# Patient Record
Sex: Female | Born: 1940 | Hispanic: Yes | Smoking: Never smoker
Health system: Southern US, Community
[De-identification: ages and names within clinical notes are randomized; demographics above are authoritative.]

## PROBLEM LIST (undated history)

## (undated) DIAGNOSIS — M810 Age-related osteoporosis without current pathological fracture: Secondary | ICD-10-CM

## (undated) DIAGNOSIS — M75102 Unspecified rotator cuff tear or rupture of left shoulder, not specified as traumatic: Secondary | ICD-10-CM

## (undated) DIAGNOSIS — J45909 Unspecified asthma, uncomplicated: Secondary | ICD-10-CM

## (undated) DIAGNOSIS — M50122 Cervical disc disorder at C5-C6 level with radiculopathy: Secondary | ICD-10-CM

## (undated) DIAGNOSIS — E785 Hyperlipidemia, unspecified: Secondary | ICD-10-CM

## (undated) DIAGNOSIS — J45991 Cough variant asthma: Secondary | ICD-10-CM

## (undated) DIAGNOSIS — G4485 Primary stabbing headache: Secondary | ICD-10-CM

## (undated) DIAGNOSIS — M19019 Primary osteoarthritis, unspecified shoulder: Secondary | ICD-10-CM

## (undated) DIAGNOSIS — I10 Essential (primary) hypertension: Secondary | ICD-10-CM

## (undated) DIAGNOSIS — M7542 Impingement syndrome of left shoulder: Secondary | ICD-10-CM

## (undated) DIAGNOSIS — M47812 Spondylosis without myelopathy or radiculopathy, cervical region: Secondary | ICD-10-CM

## (undated) DIAGNOSIS — S060X9A Concussion with loss of consciousness of unspecified duration, initial encounter: Secondary | ICD-10-CM

## (undated) HISTORY — DX: Concussion with loss of consciousness of unspecified duration, initial encounter: S06.0X9A

## (undated) HISTORY — DX: Hyperlipidemia, unspecified: E78.5

## (undated) HISTORY — DX: Essential (primary) hypertension: I10

## (undated) HISTORY — DX: Spondylosis without myelopathy or radiculopathy, cervical region: M47.812

## (undated) HISTORY — DX: Cervical disc disorder at C5-C6 level with radiculopathy: M50.122

## (undated) HISTORY — DX: Age-related osteoporosis without current pathological fracture: M81.0

## (undated) HISTORY — DX: Unspecified asthma, uncomplicated: J45.909

## (undated) HISTORY — DX: Impingement syndrome of left shoulder: M75.42

## (undated) HISTORY — PX: SHOULDER SURGERY: SHX246

## (undated) HISTORY — DX: Cough variant asthma: J45.991

## (undated) HISTORY — DX: Primary osteoarthritis, unspecified shoulder: M19.019

---
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
## 1988-12-16 HISTORY — PX: PARTIAL HYSTERECTOMY: SHX80

## 1990-12-16 HISTORY — PX: KNEE SURGERY: SHX244

## 2000-05-09 ENCOUNTER — Encounter: Admission: RE | Admit: 2000-05-09 | Discharge: 2000-05-09 | Payer: Self-pay | Admitting: Family Medicine

## 2000-05-09 ENCOUNTER — Encounter: Payer: Self-pay | Admitting: Family Medicine

## 2001-05-29 ENCOUNTER — Encounter: Payer: Self-pay | Admitting: Family Medicine

## 2001-05-29 ENCOUNTER — Encounter: Admission: RE | Admit: 2001-05-29 | Discharge: 2001-05-29 | Payer: Self-pay | Admitting: Family Medicine

## 2002-06-08 ENCOUNTER — Encounter: Payer: Self-pay | Admitting: Family Medicine

## 2002-06-08 ENCOUNTER — Encounter: Admission: RE | Admit: 2002-06-08 | Discharge: 2002-06-08 | Payer: Self-pay | Admitting: Family Medicine

## 2002-06-25 ENCOUNTER — Encounter: Payer: Self-pay | Admitting: Family Medicine

## 2002-06-25 ENCOUNTER — Encounter: Admission: RE | Admit: 2002-06-25 | Discharge: 2002-06-25 | Payer: Self-pay | Admitting: Family Medicine

## 2002-12-16 HISTORY — PX: SHOULDER SURGERY: SHX246

## 2003-08-02 ENCOUNTER — Encounter: Admission: RE | Admit: 2003-08-02 | Discharge: 2003-08-02 | Payer: Self-pay | Admitting: Family Medicine

## 2003-08-02 ENCOUNTER — Encounter: Payer: Self-pay | Admitting: Family Medicine

## 2004-10-02 ENCOUNTER — Other Ambulatory Visit: Admission: RE | Admit: 2004-10-02 | Discharge: 2004-10-02 | Payer: Self-pay | Admitting: Family Medicine

## 2004-10-10 ENCOUNTER — Encounter: Admission: RE | Admit: 2004-10-10 | Discharge: 2004-10-10 | Payer: Self-pay | Admitting: Family Medicine

## 2005-01-01 ENCOUNTER — Ambulatory Visit (HOSPITAL_COMMUNITY): Admission: RE | Admit: 2005-01-01 | Discharge: 2005-01-01 | Payer: Self-pay | Admitting: Gastroenterology

## 2005-07-15 ENCOUNTER — Encounter: Admission: RE | Admit: 2005-07-15 | Discharge: 2005-07-15 | Payer: Self-pay | Admitting: Family Medicine

## 2006-02-11 ENCOUNTER — Encounter: Admission: RE | Admit: 2006-02-11 | Discharge: 2006-02-11 | Payer: Self-pay | Admitting: Family Medicine

## 2006-08-26 ENCOUNTER — Emergency Department (HOSPITAL_COMMUNITY): Admission: EM | Admit: 2006-08-26 | Discharge: 2006-08-26 | Payer: Self-pay | Admitting: Emergency Medicine

## 2006-10-31 ENCOUNTER — Other Ambulatory Visit: Admission: RE | Admit: 2006-10-31 | Discharge: 2006-10-31 | Payer: Self-pay | Admitting: Family Medicine

## 2006-12-16 HISTORY — PX: CATARACT EXTRACTION: SUR2

## 2007-02-23 ENCOUNTER — Encounter: Admission: RE | Admit: 2007-02-23 | Discharge: 2007-02-23 | Payer: Self-pay | Admitting: Family Medicine

## 2007-11-09 ENCOUNTER — Other Ambulatory Visit: Admission: RE | Admit: 2007-11-09 | Discharge: 2007-11-09 | Payer: Self-pay | Admitting: Family Medicine

## 2008-04-29 ENCOUNTER — Encounter: Admission: RE | Admit: 2008-04-29 | Discharge: 2008-04-29 | Payer: Self-pay | Admitting: Family Medicine

## 2008-10-15 ENCOUNTER — Inpatient Hospital Stay (HOSPITAL_COMMUNITY): Admission: EM | Admit: 2008-10-15 | Discharge: 2008-10-18 | Payer: Self-pay | Admitting: Emergency Medicine

## 2009-05-08 ENCOUNTER — Encounter: Admission: RE | Admit: 2009-05-08 | Discharge: 2009-05-08 | Payer: Self-pay | Admitting: Family Medicine

## 2010-05-30 ENCOUNTER — Encounter: Admission: RE | Admit: 2010-05-30 | Discharge: 2010-05-30 | Payer: Self-pay | Admitting: Family Medicine

## 2010-12-16 DIAGNOSIS — S060XAA Concussion with loss of consciousness status unknown, initial encounter: Secondary | ICD-10-CM

## 2010-12-16 DIAGNOSIS — S060X9A Concussion with loss of consciousness of unspecified duration, initial encounter: Secondary | ICD-10-CM

## 2010-12-16 HISTORY — DX: Concussion with loss of consciousness status unknown, initial encounter: S06.0XAA

## 2010-12-16 HISTORY — DX: Concussion with loss of consciousness of unspecified duration, initial encounter: S06.0X9A
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
## 2011-05-03 NOTE — Op Note (Signed)
NAMEESTEL, TONELLI             ACCOUNT NO.:  [id number]   MEDICAL RECORD NO.:  [id number]          PATIENT TYPE:  AMB   LOCATION:  ENDO                         FACILITY:  [facility]   PHYSICIAN:  James L. Malon Kindle., M.D.DATE OF BIRTH:  1941/11/02   DATE OF PROCEDURE:  01/01/2005  DATE OF DISCHARGE:                                 OPERATIVE REPORT   PROCEDURE:  Colonoscopy.   MEDICATIONS:  1.  Fentanyl 75 mcg.  2.  Versed 9 mg IV.   SCOPE:  Olympus pediatric adjustable colonoscope.   INDICATIONS:  Strong family history of colon cancer and a brother, and  daughter has had colon polyps.  The patient moved here and had a colonoscopy  over five years ago in another city.   DESCRIPTION OF PROCEDURE:  After the procedure had been explained to the  patient and consent obtained, in the left lateral decubitus position the  Olympus scope was inserted and advanced.  The prep was excellent.  We were  able to reach the cecum using abdominal pressure and position change.  The  ileocecal valve and appendiceal orifice were seen.  The scope was withdrawn,  and the cecum, ascending colon, transverse colon, descending, and sigmoid  colon were seen well.  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  No polyps  were seen throughout.  The rectum was free of polyps.  The scope was  withdrawn.  The patient tolerated the procedure well.   ASSESSMENT:  1.  Normal colonoscopy.  Strong family history of colon cancer. (V16.0)  2.  Diverticulosis.  (562.10)   I will recommend yearly Hemoccults and repeat colonoscopy in five years.       JLE/MEDQ  D:  01/01/2005  T:  01/01/2005  Job:  [id number]   cc:   Molly Maduro L. Foy Guadalajara, M.D.  [address] [place]  [place] [address]  Fax: [phone]

## 2011-06-25 ENCOUNTER — Encounter (HOSPITAL_BASED_OUTPATIENT_CLINIC_OR_DEPARTMENT_OTHER)
Admission: RE | Admit: 2011-06-25 | Discharge: 2011-06-25 | Disposition: A | Discharge status: Home / Self Care | Payer: Medicare Other | Source: Ambulatory Visit | Attending: Orthopedic Surgery | Admitting: Orthopedic Surgery

## 2011-06-25 LAB — BASIC METABOLIC PANEL
BUN: 17 mg/dL (ref 6–23)
CO2: 31 mEq/L (ref 19–32)
Calcium: 9.1 mg/dL (ref 8.4–10.5)
Chloride: 106 mEq/L (ref 96–112)
Creatinine, Ser: 0.62 mg/dL (ref 0.50–1.10)
GFR calc Af Amer: >60 mL/min (ref >60)
GFR calc non Af Amer: >60 mL/min (ref >60)
Glucose, Bld: 87 mg/dL (ref 70–99)
Potassium: 3.9 mEq/L (ref 3.5–5.1)
Sodium: 143 mEq/L (ref 135–145)

## 2011-06-27 ENCOUNTER — Ambulatory Visit (HOSPITAL_BASED_OUTPATIENT_CLINIC_OR_DEPARTMENT_OTHER)
Admission: RE | Admit: 2011-06-27 | Discharge: 2011-06-27 | Disposition: A | Discharge status: Home / Self Care | Payer: Medicare Other | Source: Ambulatory Visit | Attending: Orthopedic Surgery | Admitting: Orthopedic Surgery

## 2011-06-27 DIAGNOSIS — M65839 Other synovitis and tenosynovitis, unspecified forearm: Secondary | ICD-10-CM | POA: Insufficient documentation

## 2011-06-27 DIAGNOSIS — Z79899 Other long term (current) drug therapy: Secondary | ICD-10-CM | POA: Insufficient documentation

## 2011-06-27 DIAGNOSIS — Z01812 Encounter for preprocedural laboratory examination: Secondary | ICD-10-CM | POA: Insufficient documentation

## 2011-06-27 DIAGNOSIS — M6740 Ganglion, unspecified site: Secondary | ICD-10-CM | POA: Insufficient documentation

## 2011-06-27 DIAGNOSIS — Z01810 Encounter for preprocedural cardiovascular examination: Secondary | ICD-10-CM | POA: Insufficient documentation

## 2011-06-27 DIAGNOSIS — M65849 Other synovitis and tenosynovitis, unspecified hand: Secondary | ICD-10-CM | POA: Insufficient documentation

## 2011-06-27 LAB — POCT HEMOGLOBIN-HEMACUE: Hemoglobin: 12.6 g/dL (ref 12.0–15.0)

## 2011-07-02 NOTE — Op Note (Signed)
Gabriella Craig, FLICKER             ACCOUNT NO.:  [id number]  MEDICAL RECORD NO.:  [id number]  LOCATION:                                 FACILITY:  PHYSICIAN:  Katy Fitch. Kasai Beltran, M.D. DATE OF BIRTH:  15-Mar-1941  DATE OF PROCEDURE:  06/27/2011 DATE OF DISCHARGE:                              OPERATIVE REPORT   PREOPERATIVE DIAGNOSIS:  Stenosing tenosynovitis, right ring finger with mass at A1-A2 pulley junction  POSTOPERATIVE DIAGNOSIS:  Large myxoid cyst, 8 mm diameter, at right ring finger A1-A2 pulley flexor retinaculum.  OPERATION: 1. Release of right ring finger A1 pulley with tenolysis of flexor     tendons. 2. Excision of myxoid cyst from right ring finger A1-A2 pulley     junction.  SURGEON:  Katy Fitch. Capri Raben, MD  ASSISTANT:  Scrub nurse.  ANESTHESIA:  2% lidocaine, metacarpal head level block and flexor sheath block of right ring finger supplemented by IV sedation.  SUPERVISING ANESTHESIOLOGIST:  Bedelia Person, MD  INDICATIONS:  The patient is a 89 year old woman referred through the courtesy of Dr. Monna Fam of [facility].  The patient had a history of painful locking of her right ring finger for the past month.  She enjoyed gardening.  After an episode of pulling weed, she noted discomfort and locking of her finger in flexion.  Clinical examination revealed stenosing tenosynovitis of her right ring finger as well as a rather large myxoid cyst distal to the A1 pulley.  We recommended proceeding with release of the A1 pulley and cyst excision.  Preoperatively, she was reminded the potential risks and benefits of surgery including infection, failure to relieve all her symptoms and the chance that she may develop triggering of other fingers over time.  Preoperatively, she was interviewed by Dr. Gypsy Craig who recommended sedation followed by local anesthesia.  This was accepted by Gabriella Craig and her daughter.  Questions were invited and answered in  the holding area.  PROCEDURE:  Gabriella Craig was brought to room #1 of the [facility], placed in supine position on the operating room table.  Following sedation, the right arm was prepped with Betadine followed by infiltration of 2 cc of 2% plain lidocaine into the path of intended incision and into the flexor sheath of the right ring finger.  The right hand and arm were then prepped with Betadine soap solution and sterilely draped.  A pneumatic tourniquet was applied on the proximal right brachium.  Following routine surgical time-out, the right arm was exsanguinated with Esmarch bandage and tourniquet inflated to 250 mmHg due to mild systolic hypertension.  Procedure commenced with a short oblique incision directly over the A1 pulley extending towards the A2 pulley.  Subcutaneous tissues were carefully divided taking care to release the palmar fascia pretendinous fibers.  Blunt Ragnell retractors were place revealing the A1 pulley. This was noted to be fibrotic with tenosynovium bulging proximally.  The pulley was split along its radial border with scalpel and scissors followed by release of a small A0 pulley proximally.  The flexor tendons delivered.  There was a rather large nodule on the flexor digitorum superficialis tendon, but no sign of a partial tendon rupture.  A more distal dissection of the wound revealed an 8 mm in diameter cyst dissecting on the distal aspect of the A1 pulley beneath the fibers of the junction with the A2 pulley.  This was circumferentially dissected and removed piecemeal with a rongeur.  Thereafter, free range of motion of the ring finger was identified. There was a slight catch due to the bump and swelling in the superficialis tendon.  The wound was inspected for bleeding points followed by repair of the skin with trauma sutures of 5-0 nylon.  A compressive dressing was applied with Xeroflo sterile gauze and Ace wrap.  There were no  apparent complications.  For aftercare, Gabriella Craig is provided a prescription for Tylenol with Codeine No. 3 one p.o. q.4-6 h p.r.n. pain, 18 tablets without refill.  We will see her back for followup in our office in 1 week for suture removal and advancement to her postoperative exercise program.     Katy Fitch. Cadie Sorci, M.D.     RVS/MEDQ  D:  06/27/2011  T:  06/27/2011  Job:  [id number]  cc:   Molly Maduro L. Foy Guadalajara, M.D. Fax: [phone]  Electronically Signed by Josephine Igo M.D. on 07/02/2011 03:15:54 PM

## 2011-08-22 ENCOUNTER — Other Ambulatory Visit: Payer: Self-pay | Admitting: Family Medicine

## 2011-08-22 DIAGNOSIS — Z1231 Encounter for screening mammogram for malignant neoplasm of breast: Secondary | ICD-10-CM

## 2011-08-29 ENCOUNTER — Ambulatory Visit
Admission: RE | Admit: 2011-08-29 | Discharge: 2011-08-29 | Disposition: A | Discharge status: Home / Self Care | Payer: Medicare Other | Source: Ambulatory Visit | Attending: Family Medicine | Admitting: Family Medicine

## 2011-08-29 DIAGNOSIS — Z1231 Encounter for screening mammogram for malignant neoplasm of breast: Secondary | ICD-10-CM

## 2011-09-16 LAB — POCT I-STAT, CHEM 8
BUN: 20
Calcium, Ion: 1.19
Chloride: 102
Creatinine, Ser: 1.1
Glucose, Bld: 120 — ABNORMAL HIGH
HCT: 39
Hemoglobin: 13.3
Potassium: 4.1
Sodium: 140
TCO2: 33

## 2011-09-17 LAB — CBC
HCT: 31.8 — ABNORMAL LOW
HCT: 32.3 — ABNORMAL LOW
HCT: 32.8 — ABNORMAL LOW
HCT: 33.9 — ABNORMAL LOW
Hemoglobin: 10.6 — ABNORMAL LOW
Hemoglobin: 10.9 — ABNORMAL LOW
Hemoglobin: 11.2 — ABNORMAL LOW
Hemoglobin: 11.2 — ABNORMAL LOW
MCHC: 33.2
MCHC: 33.4
MCHC: 33.8
MCHC: 34.2
MCV: 93.4
MCV: 94.4
MCV: 94.7
MCV: 95.5
Platelets: 191
Platelets: 197
Platelets: 206
Platelets: 217
RBC: 3.33 — ABNORMAL LOW
RBC: 3.42 — ABNORMAL LOW
RBC: 3.52 — ABNORMAL LOW
RBC: 3.58 — ABNORMAL LOW
RDW: 13.4
RDW: 13.6
RDW: 13.6
RDW: 13.8
WBC: 10.2
WBC: 6.9
WBC: 8.9
WBC: 9.5

## 2011-09-17 LAB — BASIC METABOLIC PANEL
BUN: 10
BUN: 11
BUN: 4 — ABNORMAL LOW
CO2: 29
CO2: 29
CO2: 31
Calcium: 7.8 — ABNORMAL LOW
Calcium: 7.8 — ABNORMAL LOW
Calcium: 8.1 — ABNORMAL LOW
Chloride: 106
Chloride: 107
Chloride: 109
Creatinine, Ser: 0.56
Creatinine, Ser: 0.62
Creatinine, Ser: 0.65
GFR calc Af Amer: >60
GFR calc Af Amer: >60
GFR calc Af Amer: >60
GFR calc non Af Amer: >60
GFR calc non Af Amer: >60
GFR calc non Af Amer: >60
Glucose, Bld: 124 — ABNORMAL HIGH
Glucose, Bld: 125 — ABNORMAL HIGH
Glucose, Bld: 135 — ABNORMAL HIGH
Potassium: 3.2 — ABNORMAL LOW
Potassium: 3.2 — ABNORMAL LOW
Potassium: 4.4
Sodium: 139
Sodium: 139
Sodium: 143

## 2011-09-17 LAB — URINALYSIS, ROUTINE W REFLEX MICROSCOPIC
Bilirubin Urine: NEGATIVE
Glucose, UA: 250 — AB
Hgb urine dipstick: NEGATIVE
Ketones, ur: NEGATIVE
Nitrite: NEGATIVE
Protein, ur: NEGATIVE
Specific Gravity, Urine: >1.046 — ABNORMAL HIGH
Urobilinogen, UA: 0.2
pH: 6.5

## 2012-09-07 ENCOUNTER — Other Ambulatory Visit: Payer: Self-pay | Admitting: Family Medicine

## 2012-09-07 DIAGNOSIS — Z1231 Encounter for screening mammogram for malignant neoplasm of breast: Secondary | ICD-10-CM

## 2012-10-08 ENCOUNTER — Ambulatory Visit
Admission: RE | Admit: 2012-10-08 | Discharge: 2012-10-08 | Disposition: A | Discharge status: Home / Self Care | Payer: Medicare Other | Source: Ambulatory Visit | Attending: Family Medicine | Admitting: Family Medicine

## 2012-10-08 DIAGNOSIS — Z1231 Encounter for screening mammogram for malignant neoplasm of breast: Secondary | ICD-10-CM

## 2013-10-22 ENCOUNTER — Other Ambulatory Visit: Payer: Self-pay

## 2013-10-22 DIAGNOSIS — Z1231 Encounter for screening mammogram for malignant neoplasm of breast: Secondary | ICD-10-CM

## 2013-11-25 ENCOUNTER — Ambulatory Visit
Admission: RE | Admit: 2013-11-25 | Discharge: 2013-11-25 | Disposition: A | Discharge status: Home / Self Care | Payer: Medicare Other | Source: Ambulatory Visit

## 2013-11-25 DIAGNOSIS — Z1231 Encounter for screening mammogram for malignant neoplasm of breast: Secondary | ICD-10-CM

## 2014-11-15 ENCOUNTER — Other Ambulatory Visit: Payer: Self-pay

## 2014-11-15 DIAGNOSIS — Z1231 Encounter for screening mammogram for malignant neoplasm of breast: Secondary | ICD-10-CM

## 2014-12-01 ENCOUNTER — Ambulatory Visit
Admission: RE | Admit: 2014-12-01 | Discharge: 2014-12-01 | Disposition: A | Discharge status: Home / Self Care | Payer: PRIVATE HEALTH INSURANCE | Source: Ambulatory Visit

## 2014-12-01 DIAGNOSIS — Z1231 Encounter for screening mammogram for malignant neoplasm of breast: Secondary | ICD-10-CM

## 2015-12-19 ENCOUNTER — Other Ambulatory Visit: Payer: Self-pay

## 2015-12-19 DIAGNOSIS — Z1231 Encounter for screening mammogram for malignant neoplasm of breast: Secondary | ICD-10-CM

## 2016-01-03 ENCOUNTER — Ambulatory Visit
Admission: RE | Admit: 2016-01-03 | Discharge: 2016-01-03 | Disposition: A | Discharge status: Home / Self Care | Payer: PRIVATE HEALTH INSURANCE | Source: Ambulatory Visit

## 2016-01-03 DIAGNOSIS — Z1231 Encounter for screening mammogram for malignant neoplasm of breast: Secondary | ICD-10-CM

## 2016-12-30 ENCOUNTER — Encounter: Payer: Self-pay | Admitting: Family Medicine

## 2016-12-30 ENCOUNTER — Ambulatory Visit (INDEPENDENT_AMBULATORY_CARE_PROVIDER_SITE_OTHER): Payer: PPO | Admitting: Family Medicine

## 2016-12-30 VITALS — BP 130/78 | HR 74 | Resp 12 | Ht 61.0 in | Wt 108.2 lb

## 2016-12-30 DIAGNOSIS — L609 Nail disorder, unspecified: Secondary | ICD-10-CM | POA: Diagnosis not present

## 2016-12-30 DIAGNOSIS — I10 Essential (primary) hypertension: Secondary | ICD-10-CM | POA: Diagnosis not present

## 2016-12-30 DIAGNOSIS — E785 Hyperlipidemia, unspecified: Secondary | ICD-10-CM | POA: Diagnosis not present

## 2016-12-30 NOTE — Progress Notes (Signed)
[name] is a 76 y.o.female, who is here today to follow on HTN.  I last saw her at [facility] about 6 months ago. Former PCP Dr Maceo Pro.  HTN for many years. BP readings at home 120's/70's. Currently she is on HCTZ 25 mg daily, wonders if she could try with no medication.  She is taking medications as instructed, no side effects reported.  She follows a healthy diet and exercises regularly.  She has not noted unusual headache, visual changes, exertional chest pain, dyspnea,  focal weakness, or edema.   HLD:  On non pharmacologic treatment. Last FLP 02/2015. About a year ago she was on statin medication, well tolerated.   -Concerned about nail changes:Toenail left hallux and finger nail right thumb.She states that for many years she has had "ugly" changes on these 2 toenails. About 8-9 years ago she was treated for 3 months  with an oral antifungal medication after part of fingernail was sent for Cx, resolved but a few months later re-occurred on both nails. She denies pain, periungual edema or erythema. She has tried multiple OTC products but they do not help. Problem seems stable.     Review of Systems  Constitutional: Negative for activity change, appetite change, fatigue, fever and unexpected weight change.  HENT: Negative for mouth sores, nosebleeds and trouble swallowing.   Eyes: Negative for pain and visual disturbance.  Respiratory: Negative for cough, shortness of breath and wheezing.   Cardiovascular: Negative for chest pain, palpitations and leg swelling.  Gastrointestinal: Negative for abdominal pain, nausea and vomiting.       Negative for changes in bowel habits.  Genitourinary: Negative for decreased urine volume, difficulty urinating and hematuria.  Skin: Negative for rash.  Neurological: Negative for syncope, weakness, numbness and headaches.  Psychiatric/Behavioral: Negative for confusion and sleep disturbance. The patient is not  nervous/anxious.      No current outpatient prescriptions on file prior to visit.   No current facility-administered medications on file prior to visit.      Past Medical History:  Diagnosis Date  . Chicken pox   . Hyperlipidemia   . Hypertension     Not on File  Social History   Social History  . Marital status: Widowed    Spouse name: N/A  . Number of children: N/A  . Years of education: N/A   Social History Main Topics  . Smoking status: Never Smoker  . Smokeless tobacco: Never Used  . Alcohol use No  . Drug use: No  . Sexual activity: Not Asked   Other Topics Concern  . None   Social History Narrative  . None    Vitals:   12/30/16 1348  BP: 130/78  Pulse: 74  Resp: 12   Body mass index is 20.45 kg/m.    Physical Exam  Nursing note and vitals reviewed. Constitutional: She is oriented to person, place, and time. She appears well-developed and well-nourished. No distress.  HENT:  Head: Atraumatic.  Mouth/Throat: Oropharynx is clear and moist and mucous membranes are normal.  Eyes: Conjunctivae and EOM are normal. Pupils are equal, round, and reactive to light.  Cardiovascular: Normal rate and regular rhythm.   No murmur heard. Pulses:      Dorsalis pedis pulses are 2+ on the right side, and 2+ on the left side.  Respiratory: Effort normal and breath sounds normal. No respiratory distress.  GI: Soft. She exhibits no mass. There is no hepatomegaly. There is no  tenderness.  Musculoskeletal: She exhibits no edema or tenderness.  Lymphadenopathy:    She has no cervical adenopathy.  Neurological: She is alert and oriented to person, place, and time. She has normal strength. Coordination and gait normal.  Skin: Skin is warm. No rash noted. No erythema.  Left big toenail and right thumb nail with irregular surfaces. Thumb nail: Thickened distal nail with keratotic debris. Toenail with mild irregularities, still smooth, vertical dense line from distal  to proximal aspect of nail.  In general there is no periungual edema or erythema. No pigmentation changes.    Psychiatric: She has a normal mood and affect.  Well groomed, good eye contact.    ASSESSMENT AND PLAN:   Gabriella Craig was seen today for establish care.  Diagnoses and all orders for this visit:   Fingernail abnormalities  Chronic, she has been treated in the past with oral antimycotic medications for onychomycosis.  We discussed other possible causes: trauma, systemic illness among some. She would like dermatology evaluation, she tells me that she does not need referral and would like to schedule appt herself.     Essential hypertension, benign  Adequately controlled. She will try non pharmacologic treatment, monitor BP daily. DASH-low salt  diet recommended. Eye exam recommended annually. F/U in 2-3 months, before if needed.   Hyperlipidemia, unspecified hyperlipidemia type  Continue low fat diet and regular physical activity. Lab in 2-3 months.     -[name] was advised to return sooner than planned today if new concerns arise.     Betty G. Martinique, MD  [facility]. [place] [facility].

## 2016-12-30 NOTE — Progress Notes (Signed)
Pre visit review using our clinic review tool, if applicable. No additional management support is needed unless otherwise documented below in the visit note. 

## 2016-12-30 NOTE — Patient Instructions (Signed)
A few things to remember from today's visit:   Fingernail abnormalities - Plan: Ambulatory referral to Dermatology  Essential hypertension, benign  Continue non pharmacologic treatment.   Please be sure medication list is accurate. If a new problem present, please set up appointment sooner than planned today.

## 2017-01-08 ENCOUNTER — Other Ambulatory Visit: Payer: Self-pay | Admitting: Family Medicine

## 2017-01-08 DIAGNOSIS — Z1231 Encounter for screening mammogram for malignant neoplasm of breast: Secondary | ICD-10-CM

## 2017-01-16 ENCOUNTER — Ambulatory Visit
Admission: RE | Admit: 2017-01-16 | Discharge: 2017-01-16 | Disposition: A | Discharge status: Home / Self Care | Payer: PPO | Source: Ambulatory Visit | Attending: Family Medicine | Admitting: Family Medicine

## 2017-01-16 DIAGNOSIS — Z1231 Encounter for screening mammogram for malignant neoplasm of breast: Secondary | ICD-10-CM

## 2017-01-16 DIAGNOSIS — D229 Melanocytic nevi, unspecified: Secondary | ICD-10-CM | POA: Diagnosis not present

## 2017-01-16 DIAGNOSIS — B351 Tinea unguium: Secondary | ICD-10-CM | POA: Diagnosis not present

## 2017-03-12 ENCOUNTER — Encounter: Payer: PPO | Admitting: Family Medicine

## 2017-03-18 ENCOUNTER — Encounter: Payer: Self-pay | Admitting: Family Medicine

## 2017-03-18 NOTE — Progress Notes (Signed)
HPI:   [name] is a 76 y.o. female, who is here today for her routine physical.  Regular exercise 3 or more time per week: Yes Following a healthy diet: Yes  She lives alone,daughter lives 8 miles from her house. Independent ADL's and IADL's. No falls in the past year and denies depression symptoms.  No hearing loss. She wears reading glasses.   Chronic medical problems: HTN and HLD,she is currently on non pharmacologic treatment. Last OV, 12/30/16 we decided to discontinue HCTZ. BP readings at home: 120/80's.  Mammogram: [facility] 1 on 01/16/17. Last pap smear 11/2007: Negative. Colonoscopy: 06/2015, diverticulosis sigmoid colon and mild internal hemorrhoids. DEXA: 01/2012, Hx of osteoporosis.She took Fosamax for 8 years and Evista for a year,decided to discontinued and not interested in further treatment. Zoster vaccine 10/2011.  Eye exam 10/2016, Hx of dry eye, Dr Jerline Pain. Dentists evaluation yesterday.   She takes Vit C, Centrum vit, Ca++,and vit D.   -She recently went to dermatologist for finger nails and toenails changes. According to pt, nail Cx was negative for fungal infection. She was recommended going to [facility] but she is reluctant to do so. This problem has been going on for years,she has taken oral antimycotic medication,which helped temporarily.   Review of Systems  Constitutional: Negative for appetite change, fatigue and fever.  HENT: Negative for hearing loss, mouth sores, trouble swallowing and voice change.   Eyes: Negative for photophobia, pain and visual disturbance.  Respiratory: Negative for cough, shortness of breath and wheezing.   Cardiovascular: Negative for chest pain and leg swelling.  Gastrointestinal: Negative for abdominal pain, nausea and vomiting.       No changes in bowel habits.  Endocrine: Negative for cold intolerance, heat intolerance, polydipsia, polyphagia and polyuria.  Genitourinary: Negative for decreased  urine volume, hematuria, vaginal bleeding and vaginal discharge.  Musculoskeletal: Negative for back pain, gait problem and myalgias.  Skin: Negative for rash.  Neurological: Negative for syncope, weakness and headaches.  Hematological: Negative for adenopathy. Does not bruise/bleed easily.  Psychiatric/Behavioral: Negative for confusion and sleep disturbance. The patient is not nervous/anxious.   All other systems reviewed and are negative.     No current outpatient prescriptions on file prior to visit.   No current facility-administered medications on file prior to visit.      Past Medical History:  Diagnosis Date  . Cervical disc disorder at C5-C6 level with radiculopathy   . Chicken pox   . Hyperlipidemia   . Hypertension   . Osteoporosis    DEXA 01/2012,she was on Fosamax and Evista.    Not on File  Family History  Problem Relation Age of Onset  . Cancer Father     esophageal  . Diabetes Sister   . Cancer Brother 65    colon  . Cancer Brother     lung    Social History   Social History  . Marital status: Widowed    Spouse name: N/A  . Number of children: N/A  . Years of education: N/A   Social History Main Topics  . Smoking status: Never Smoker  . Smokeless tobacco: Never Used  . Alcohol use No  . Drug use: No  . Sexual activity: Not Asked   Other Topics Concern  . None   Social History Narrative  . None     Vitals:   03/19/17 0807  BP: 112/80  Pulse: 73  Resp: 12   Body mass index  is 20.24 kg/m.  O2 sat 98% at RA.  Wt Readings from Last 3 Encounters:  03/19/17 107 lb 2 oz (48.6 kg)  12/30/16 108 lb 4 oz (49.1 kg)     Physical Exam  Nursing note and vitals reviewed. Constitutional: She is oriented to person, place, and time. She appears well-developed and well-nourished. No distress.  HENT:  Head: Atraumatic.  Mouth/Throat: Uvula is midline, oropharynx is clear and moist and mucous membranes are normal.  Eyes: Conjunctivae and  EOM are normal. Pupils are equal, round, and reactive to light.  Neck: No tracheal deviation present. No thyroid mass and no thyromegaly present.  Cardiovascular: Normal rate and regular rhythm.   No murmur heard. Pulses:      Dorsalis pedis pulses are 2+ on the right side, and 2+ on the left side.  Respiratory: Effort normal and breath sounds normal. No respiratory distress.  GI: Soft. She exhibits no mass. There is no hepatomegaly. There is no tenderness.  Genitourinary: No breast swelling, tenderness or discharge.  Musculoskeletal: She exhibits no edema.  No major deformity or signs of synovitis appreciated.  Lymphadenopathy:    She has no cervical adenopathy.    She has no axillary adenopathy.       Right: No supraclavicular adenopathy present.       Left: No supraclavicular adenopathy present.  Neurological: She is alert and oriented to person, place, and time. She has normal strength. No cranial nerve deficit. Coordination and gait normal.  Reflex Scores:      Bicep reflexes are 2+ on the right side and 2+ on the left side.      Patellar reflexes are 2+ on the right side and 2+ on the left side. Skin: Skin is warm. No rash noted. No erythema.  Right thumb finger nail with hypertrophic changes.  Psychiatric: She has a normal mood and affect. Cognition and memory are normal.  Well groomed, good eye contact.      ASSESSMENT AND PLAN:   Gabriella Craig was seen today for annual exam.  Diagnoses and all orders for this visit:   Lab Results  Component Value Date   CHOL 214 (H) 03/19/2017   HDL 67.50 03/19/2017   LDLCALC 126 (H) 03/19/2017   TRIG 105.0 03/19/2017   CHOLHDL 3 03/19/2017   Lab Results  Component Value Date   CREATININE 0.69 03/19/2017   BUN 26 (H) 03/19/2017   NA 142 03/19/2017   K 4.5 03/19/2017   CL 105 03/19/2017   CO2 31 03/19/2017    Routine general medical examination at a health care facility   We discussed the importance of regular physical  activity and healthy diet for prevention of chronic illness and/or complications. Preventive guidelines reviewed. Vaccination up to date.  Ca++ and vit D supplementation to continue. Next CPE in 1 year.  Essential hypertension, benign  Adequately controlled on non pharmacologic treatment. Continue monitoring BP. DASH diet recommended. Eye exam current. F/U in 12 months, before if needed.  -     Basic metabolic panel  Hyperlipidemia, unspecified hyperlipidemia type  Further recommendations will be given according to lab results. Continue low-fat diet.  -     Lipid panel  Osteoporosis, unspecified osteoporosis type, unspecified pathological fracture presence  Fall precautions. Continue regular physical activity. Continue Ca++ with Vit D. She is not interested in further studies/treatments.   -     VITAMIN D 25 Hydroxy (Vit-D Deficiency, Fractures)  Nail hypertrophy  Topical urea might help with hypertrophic fingernails.  Advised to follow recommendations given by her dermatologist if she still wants to pursue treatment. F/U as needed.  -     Urea 40 % GEL; Apply 1 application topically 2 (two) times daily as needed.     Return in 1 year (on 03/19/2018) for routine.      Gabriella Craig G. Martin[place], MD  [facility]. [place] office.

## 2017-03-19 ENCOUNTER — Encounter: Payer: Self-pay | Admitting: Family Medicine

## 2017-03-19 ENCOUNTER — Ambulatory Visit (INDEPENDENT_AMBULATORY_CARE_PROVIDER_SITE_OTHER): Payer: PPO | Admitting: Family Medicine

## 2017-03-19 VITALS — BP 112/80 | HR 73 | Resp 12 | Ht 61.0 in | Wt 107.1 lb

## 2017-03-19 DIAGNOSIS — E785 Hyperlipidemia, unspecified: Secondary | ICD-10-CM | POA: Diagnosis not present

## 2017-03-19 DIAGNOSIS — I10 Essential (primary) hypertension: Secondary | ICD-10-CM

## 2017-03-19 DIAGNOSIS — Z0000 Encounter for general adult medical examination without abnormal findings: Secondary | ICD-10-CM

## 2017-03-19 DIAGNOSIS — L602 Onychogryphosis: Secondary | ICD-10-CM | POA: Diagnosis not present

## 2017-03-19 DIAGNOSIS — M810 Age-related osteoporosis without current pathological fracture: Secondary | ICD-10-CM | POA: Diagnosis not present

## 2017-03-19 LAB — LIPID PANEL
Cholesterol: 214 mg/dL — ABNORMAL HIGH (ref 0–200)
HDL: 67.5 mg/dL (ref >39.00)
LDL Cholesterol: 126 mg/dL — ABNORMAL HIGH (ref 0–99)
NonHDL: 146.99
Total CHOL/HDL Ratio: 3
Triglycerides: 105 mg/dL (ref 0.0–149.0)
VLDL: 21 mg/dL (ref 0.0–40.0)

## 2017-03-19 LAB — BASIC METABOLIC PANEL
BUN: 26 mg/dL — ABNORMAL HIGH (ref 6–23)
CO2: 31 mEq/L (ref 19–32)
Calcium: 9.5 mg/dL (ref 8.4–10.5)
Chloride: 105 mEq/L (ref 96–112)
Creatinine, Ser: 0.69 mg/dL (ref 0.40–1.20)
GFR: 87.57 mL/min (ref >60.00)
Glucose, Bld: 84 mg/dL (ref 70–99)
Potassium: 4.5 mEq/L (ref 3.5–5.1)
Sodium: 142 mEq/L (ref 135–145)

## 2017-03-19 LAB — VITAMIN D 25 HYDROXY (VIT D DEFICIENCY, FRACTURES): VITD: 55.14 ng/mL (ref 30.00–100.00)

## 2017-03-19 MED ORDER — UREA 40 % EX GEL: 1.0000 "application " | Freq: Two times a day (BID) | CUTANEOUS | 0 refills | Status: DC | PRN

## 2017-03-19 NOTE — Progress Notes (Signed)
Pre visit review using our clinic review tool, if applicable. No additional management support is needed unless otherwise documented below in the visit note. 

## 2017-03-19 NOTE — Patient Instructions (Signed)
A few things to remember from today's visit:   Essential hypertension, benign - Plan: Basic metabolic panel  Hyperlipidemia, unspecified hyperlipidemia type - Plan: Lipid panel  Routine general medical examination at a health care facility  A few tips:  -As we age balance is not as good as it was, so there is a higher risks for falls. Please remove small rugs and furniture that is "in your way" and could increase the risk of falls. Stretching exercises may help with fall prevention: Yoga and Tai Chi are some examples. Low impact exercise is better, so you are not very achy the next day.  -Sun screen and avoidance of direct sun light recommended. Caution with dehydration, if working outdoors be sure to drink enough fluids.  - Some medications are not safe as we age, increases the risk of side effects and can potentially interact with other medication you are also taken;  including some of over the counter medications. Be sure to let me know when you start a new medication even if it is a dietary/vitamin supplement.   -Healthy diet low in red meet/animal fat and sugar + regular physical activity is recommended.      Please be sure medication list is accurate. If a new problem present, please set up appointment sooner than planned today.

## 2017-03-20 ENCOUNTER — Telehealth: Payer: Self-pay

## 2017-03-20 NOTE — Telephone Encounter (Signed)
Received a fax from the pharmacy that they are unable to order the Urea. Is there something else that we can send in?

## 2017-03-25 NOTE — Telephone Encounter (Signed)
Left voicemail for patient letting her know to talk with her dermatologist. Also advised that labs had been placed in the mail and she should receive them in a few days.

## 2017-03-25 NOTE — Telephone Encounter (Signed)
This is the only medication I though may help. She may want to follow dermatologist recommendation in regard to seeing a specialists in [place].  Thanks, BJ

## 2017-06-09 ENCOUNTER — Telehealth: Payer: Self-pay | Admitting: Family Medicine

## 2017-06-09 NOTE — Telephone Encounter (Signed)
I do not think I prescribed this medication. I know she is following with dermatologists for toenail and finger nail deformity.    Thanks, BJ

## 2017-06-09 NOTE — Telephone Encounter (Signed)
Received a fax from the pharmacy.  Pt is requesting a prescription for ciclopirox 8% solution. Please advise.

## 2017-06-09 NOTE — Telephone Encounter (Signed)
Rx refused

## 2017-06-09 NOTE — Addendum Note (Signed)
Addended by: Kateri Mc E on: 06/09/2017 05:33 PM   Modules accepted: Orders

## 2017-10-23 DIAGNOSIS — H16223 Keratoconjunctivitis sicca, not specified as Sjogren's, bilateral: Secondary | ICD-10-CM | POA: Diagnosis not present

## 2017-10-23 DIAGNOSIS — H2513 Age-related nuclear cataract, bilateral: Secondary | ICD-10-CM | POA: Diagnosis not present

## 2017-10-23 DIAGNOSIS — D3132 Benign neoplasm of left choroid: Secondary | ICD-10-CM | POA: Diagnosis not present

## 2017-12-01 DIAGNOSIS — H2511 Age-related nuclear cataract, right eye: Secondary | ICD-10-CM | POA: Diagnosis not present

## 2017-12-15 ENCOUNTER — Encounter: Payer: Self-pay | Admitting: Urgent Care

## 2017-12-15 ENCOUNTER — Ambulatory Visit (INDEPENDENT_AMBULATORY_CARE_PROVIDER_SITE_OTHER): Payer: PPO | Admitting: Urgent Care

## 2017-12-15 ENCOUNTER — Ambulatory Visit: Payer: Self-pay | Admitting: *Deleted

## 2017-12-15 ENCOUNTER — Ambulatory Visit (INDEPENDENT_AMBULATORY_CARE_PROVIDER_SITE_OTHER): Payer: PPO

## 2017-12-15 VITALS — BP 182/80 | HR 68 | Temp 97.9°F | Resp 18

## 2017-12-15 DIAGNOSIS — R42 Dizziness and giddiness: Secondary | ICD-10-CM

## 2017-12-15 DIAGNOSIS — I10 Essential (primary) hypertension: Secondary | ICD-10-CM

## 2017-12-15 DIAGNOSIS — R05 Cough: Secondary | ICD-10-CM

## 2017-12-15 DIAGNOSIS — R059 Cough, unspecified: Secondary | ICD-10-CM

## 2017-12-15 DIAGNOSIS — R82998 Other abnormal findings in urine: Secondary | ICD-10-CM | POA: Diagnosis not present

## 2017-12-15 DIAGNOSIS — R030 Elevated blood-pressure reading, without diagnosis of hypertension: Secondary | ICD-10-CM

## 2017-12-15 LAB — POCT URINALYSIS DIP (MANUAL ENTRY)
Bilirubin, UA: NEGATIVE
Blood, UA: NEGATIVE
Glucose, UA: NEGATIVE mg/dL
Ketones, POC UA: NEGATIVE mg/dL
Nitrite, UA: NEGATIVE
Protein Ur, POC: NEGATIVE mg/dL
Spec Grav, UA: 1.02 (ref 1.010–1.025)
Urobilinogen, UA: 0.2 E.U./dL
pH, UA: 7.5 (ref 5.0–8.0)

## 2017-12-15 LAB — POCT CBC
Granulocyte percent: 68.4 %G (ref 37–80)
HCT, POC: 38.8 % (ref 37.7–47.9)
Hemoglobin: 12.5 g/dL (ref 12.2–16.2)
Lymph, poc: 1.7 (ref 0.6–3.4)
MCH, POC: 29.8 pg (ref 27–31.2)
MCHC: 32.2 g/dL (ref 31.8–35.4)
MCV: 92.4 fL (ref 80–97)
MID (cbc): 0.3 (ref 0–0.9)
MPV: 7 fL (ref 0–99.8)
POC Granulocyte: 4.4 (ref 2–6.9)
POC LYMPH PERCENT: 27 %L (ref 10–50)
POC MID %: 4.6 %M (ref 0–12)
Platelet Count, POC: 273 10*3/uL (ref 142–424)
RBC: 4.2 M/uL (ref 4.04–5.48)
RDW, POC: 13.8 %
WBC: 6.4 10*3/uL (ref 4.6–10.2)

## 2017-12-15 LAB — POCT GLYCOSYLATED HEMOGLOBIN (HGB A1C): Hemoglobin A1C: 5.5

## 2017-12-15 MED ORDER — ONDANSETRON 8 MG PO TBDP: 8.0000 mg | ORAL_TABLET | Freq: Three times a day (TID) | ORAL | 0 refills | Status: DC | PRN

## 2017-12-15 MED ORDER — MECLIZINE HCL 25 MG PO TABS: 25.0000 mg | ORAL_TABLET | Freq: Two times a day (BID) | ORAL | 0 refills | Status: DC

## 2017-12-15 MED ORDER — BENZONATATE 100 MG PO CAPS: 100.0000 mg | ORAL_CAPSULE | Freq: Three times a day (TID) | ORAL | 0 refills | Status: DC | PRN

## 2017-12-15 MED ORDER — CEPHALEXIN 500 MG PO CAPS: 500.0000 mg | ORAL_CAPSULE | Freq: Three times a day (TID) | ORAL | 0 refills | Status: DC

## 2017-12-15 NOTE — Progress Notes (Signed)
MRN: [id number] DOB: Dec 22, 1940  Subjective:   Gabriella Craig is a 76 y.o. female presenting for 3 day history of persistent dizziness, started out as vertigo, intermittent nausea. Has also had 2 month history of dry cough. Has tried 2mg  Zofran with relief of her nausea. Denies history of HTN. Denies headache, chest pain, shortness of breath, heart racing, palpitations, vomiting, abdominal pain, hematuria, lower leg swelling. Denies smoking cigarettes or drinking alcohol.  Gabriella Craig is not currently taking any medications and is allergic to [employer] hcl].  Gabriella Craig  has a past medical history of Cervical disc disorder at C5-C6 level with radiculopathy, Chicken pox, Hyperlipidemia, Hypertension, and Osteoporosis. Denies past surgical history.   Objective:   Vitals: BP (!) 182/80   Pulse 68   Temp 97.9 F (36.6 C) (Oral)   Resp 18   SpO2 99%   BP Readings from Last 3 Encounters:  12/15/17 (!) 182/80  03/19/17 112/80  12/30/16 130/78    Physical Exam  Constitutional: She is oriented to person, place, and time. She appears well-developed and well-nourished.  HENT:  TM's intact bilaterally, no effusions or erythema. Nasal turbinates pink and moist, nasal passages patent. No sinus tenderness. Oropharynx clear, mucous membranes moist.   Eyes: EOM are normal. Pupils are equal, round, and reactive to light. Right eye exhibits no discharge. Left eye exhibits no discharge. No scleral icterus.  Neck: Normal range of motion. Neck supple.  Cardiovascular: Normal rate, regular rhythm and intact distal pulses. Exam reveals no gallop and no friction rub.  No murmur heard. Pulmonary/Chest: No respiratory distress. She has no wheezes. She has no rales.  Abdominal: Soft. Bowel sounds are normal. She exhibits no distension and no mass. There is no tenderness. There is no guarding.  No CVA tenderness.  Musculoskeletal: She exhibits no edema.  Strength 5/5.  Lymphadenopathy:    She has no  cervical adenopathy.  Neurological: She is alert and oriented to person, place, and time. She displays normal reflexes. No cranial nerve deficit. Coordination normal.  Negative Romberg and Pronator Drift. Speech intact.  Skin: Skin is warm and dry.  Psychiatric: She has a normal mood and affect.    Results for orders placed or performed in visit on 12/15/17 (from the past 24 hour(s))  POCT CBC     Status: None   Collection Time: 12/15/17  3:06 PM  Result Value Ref Range   WBC 6.4 4.6 - 10.2 K/uL   Lymph, poc 1.7 0.6 - 3.4   POC LYMPH PERCENT 27.0 10 - 50 %L   MID (cbc) 0.3 0 - 0.9   POC MID % 4.6 0 - 12 %M   POC Granulocyte 4.4 2 - 6.9   Granulocyte percent 68.4 37 - 80 %G   RBC 4.20 4.04 - 5.48 M/uL   Hemoglobin 12.5 12.2 - 16.2 g/dL   HCT, POC 38.8 37.7 - 47.9 %   MCV 92.4 80 - 97 fL   MCH, POC 29.8 27 - 31.2 pg   MCHC 32.2 31.8 - 35.4 g/dL   RDW, POC 13.8 %   Platelet Count, POC 273 142 - 424 K/uL   MPV 7.0 0 - 99.8 fL  POCT urinalysis dipstick     Status: Abnormal   Collection Time: 12/15/17  3:10 PM  Result Value Ref Range   Color, UA yellow yellow   Clarity, UA clear clear   Glucose, UA negative negative mg/dL   Bilirubin, UA negative negative   Ketones, POC UA negative  negative mg/dL   Spec Grav, UA 1.020 1.010 - 1.025   Blood, UA negative negative   pH, UA 7.5 5.0 - 8.0   Protein Ur, POC negative negative mg/dL   Urobilinogen, UA 0.2 0.2 or 1.0 E.U./dL   Nitrite, UA Negative Negative   Leukocytes, UA Small (1+) (A) Negative  POCT glycosylated hemoglobin (Hb A1C)     Status: None   Collection Time: 12/15/17  3:11 PM  Result Value Ref Range   Hemoglobin A1C 5.5    Dg Chest 2 View  Result Date: 12/15/2017 CLINICAL DATA:  Patient with cough.  Dizziness. EXAM: CHEST  2 VIEW COMPARISON:  Chest radiograph 10/24/2009 FINDINGS: Stable cardiac and mediastinal contours. No consolidative pulmonary opacities. No pleural effusion or pneumothorax. Thoracic spine  degenerative changes. IMPRESSION: No active cardiopulmonary disease. Electronically Signed   By: Lovey Newcomer M.D.   On: 12/15/2017 15:39   Assessment and Plan :   Dizziness - Plan: POCT glycosylated hemoglobin (Hb A1C), Urine Culture, DG Chest 2 View  Leukocytes in urine  Cough - Plan: DG Chest 2 View  Essential hypertension, benign - Plan: POCT CBC, POCT urinalysis dipstick, Comprehensive metabolic panel  Elevated blood pressure reading   Will manage symptoms supportively and address infectious process as cystitis with Keflex, labs pending. Monitor BP at home with daughter. Return-to-clinic precautions discussed, patient verbalized understanding.   Jaynee Eagles, PA-C [facility] [phone] 12/15/2017  2:49 PM

## 2017-12-15 NOTE — Patient Instructions (Signed)
Infeccin de las vas urinarias, en adultos  Urinary Tract Infection, Adult  Una infeccin de las vas urinarias (IVU) es una infeccin en cualquier parte de las vas urinarias, que incluyen los riones, los urteres, la vejiga y la uretra. Estos rganos fabrican, almacenan y eliminan la orina del organismo. La IVU puede ser una infeccin de la vejiga (cistitis) o una infeccin renal (pielonefritis).  Cules son las causas?  Esta infeccin puede deberse a hongos, virus o bacterias. Las bacterias son la causa ms comunes de las IVU. Esta afeccin tambin puede ser provocada por no vaciar la vejiga por completo durante la miccin en repetidas ocasiones.  Qu incrementa el riesgo?  Es ms probable que esta afeccin se manifieste si:   Usted ignora la necesidad de orinar o retiene la orina durante mucho tiempo.   No vaca la vejiga completamente durante la miccin.   Es una mujer y se limpia de atrs hacia adelante despus de orinar o defecar.   Es un hombre y est circuncidado.   Tiene estreimiento.   Tiene colocado un catter urinario (sonda urinaria) permanente.   Tiene debilitado el sistema de defensa (inmunitario) del cuerpo.   Tiene una enfermedad que afecta los intestinos, los riones o la vejiga.   Tiene diabetes.   Toma antibiticos con frecuencia o durante largos perodos, y los antibiticos ya no resultan eficaces para combatir algunos tipos de infecciones (resistencia a los antibiticos).   Toma medicamentos que le irritan las vas urinarias.   Est expuesto a sustancias qumicas que le irritan las vas urinarias.   Es mujer.    Cules son los signos o los sntomas?  Los sntomas de esta afeccin incluyen lo siguiente:   Fiebre.   Miccin frecuente o eliminacin de pequeas cantidades de orina con frecuencia.   Necesidad urgente de orinar.   Ardor o dolor al orinar.   Orina con mal olor u olor atpico.   Orina turbia.   Dolor en la parte baja del abdomen o en la espalda.   Dificultad  para orinar.   Presencia de sangre en la orina.   Tener vmitos o menos apetito de lo normal.   Diarrea o dolor abdominal.   Secrecin vaginal, si es mujer.    Cmo se diagnostica?  Esta afeccin se diagnostica con base en la historia clnica y un examen fsico. Tambin deber proporcionar una muestra de orina para realizar anlisis. Podrn indicarle otros estudios, por ejemplo:   Anlisis de sangre.   Anlisis de enfermedades de transmisin sexual (ETS).    Si ha tenido ms de una IVU, se pueden hacer estudios de diagnstico por imgenes o una cistoscopia para determinar la causa de las infecciones.  Cmo se trata?  El tratamiento de esta afeccin suele incluir una combinacin de dos o ms de los siguientes:   Antibiticos.   Otros medicamentos para tratar causas menos frecuentes de IVU.   Medicamentos de venta libre para aliviar el dolor.   Cantidad suficiente agua para mantenerse hidratado.    Siga estas indicaciones en su casa:   Tome los medicamentos de venta libre y los recetados solamente como se lo haya indicado el mdico.   Si le recetaron un antibitico, tmelo como se lo haya indicado el mdico. No deje de tomar el antibitico aunque comience a sentirse mejor.   Evite el alcohol, la cafena, el t y las bebidas gaseosas. Estas bebidas pueden irritar la vejiga.   Beba suficiente lquido para mantener la orina [place] o de   color amarillo plido.   Concurra a todas las visitas de seguimiento como se lo haya indicado el mdico. Esto es importante.   Asegrese de lo siguiente:  ? Vaciar la vejiga con frecuencia y en su totalidad. No retener la orina durante largos perodos.  ? Vaciar la vejiga antes y despus de tener relaciones sexuales.  ? Limpiarse de adelante hacia atrs despus de defecar, si es mujer. Usar cada trozo de papel higinico solo una vez cuando se limpie.  Comunquese con un mdico si:   Siente dolor en la espalda.   Tiene fiebre.   Siente nuseas o vomita.   Los sntomas  no mejoran despus de 3das de tratamiento.   Los sntomas desaparecen y luego vuelven a aparecer.  Solicite ayuda de inmediato si:   Siente dolor intenso en la espalda o en la zona inferior del abdomen.   Tiene vmitos y no puede tragar los medicamentos ni tomar agua.  Esta informacin no tiene como fin reemplazar el consejo del mdico. Asegrese de hacerle al mdico cualquier pregunta que tenga.  Document Released: 09/11/2005 Document Revised: 03/19/2017 Document Reviewed: 10/23/2015  Elsevier Interactive Patient Education  2018 [employer]vier Inc[employer]

## 2017-12-15 NOTE — Telephone Encounter (Signed)
Called in c/o being "very dizzy"   It started Thursday after eating lunch at [employer].   She denies having any other s/s except the dizziness.   Denies one sided weakness, vision changes, n/v, or HA.   Her daughter is a Marine scientist and gave her a pill called Ondansetron that did not help with the dizziness. She is having to hold onto things to walk around today. I referred her to the ED.    She has someone that is going to take her to the [facility] ED.   (Her daughter is working).   Reason for Disposition . SEVERE dizziness (e.g., unable to stand, requires support to walk, feels like passing out now)  Answer Assessment - Initial Assessment Questions 1. SYMPTOM: "What is the main symptom you are concerned about?" (e.g., weakness, numbness)     Dizziness that started Thursday after eating at [employer]. 2. ONSET: "When did this start?" (minutes, hours, days; while sleeping)     Thursday at 1:00.   I was fine before I ate 3. LAST NORMAL: "When was the last time you were normal (no symptoms)?"     I was fine before I ate lunch.    4. PATTERN "Does this come and go, or has it been constant since it started?"  "Is it present now?"     The dizziness is there all the time.   I feel like I'm in a boat.   Room not spinning.   I don't take any medications.   My daughter is a Marine scientist and she gave me a Ondansetron but it didn't help. 5. CARDIAC SYMPTOMS: "Have you had any of the following symptoms: chest pain, difficulty breathing, palpitations?"     I don't feel nauseated.   Don't have a HA.   No other symptoms except the dizziness.  No heart problems. 6. NEUROLOGIC SYMPTOMS: "Have you had any of the following symptoms: headache, dizziness, vision loss, double vision, changes in speech, unsteady on your feet?"     I have to hold onto things to walk around.   Vision normal. 7. OTHER SYMPTOMS: "Do you have any other symptoms?"     No other problem. 8. PREGNANCY: "Is there any chance you are  pregnant?" "When was your last menstrual period?"     Not asked due to age  Protocols used: [place], NEUROLOGIC DEFICIT-A-AH

## 2017-12-16 HISTORY — PX: CATARACT EXTRACTION: SUR2

## 2017-12-16 LAB — COMPREHENSIVE METABOLIC PANEL
ALT: 12 IU/L (ref 0–32)
AST: 16 IU/L (ref 0–40)
Albumin/Globulin Ratio: 1.5 (ref 1.2–2.2)
Albumin: 4.1 g/dL (ref 3.5–4.8)
Alkaline Phosphatase: 80 IU/L (ref 39–117)
BUN/Creatinine Ratio: 31 — ABNORMAL HIGH (ref 12–28)
BUN: 22 mg/dL (ref 8–27)
Bilirubin Total: 0.3 mg/dL (ref 0.0–1.2)
CO2: 25 mmol/L (ref 20–29)
Calcium: 9.4 mg/dL (ref 8.7–10.3)
Chloride: 106 mmol/L (ref 96–106)
Creatinine, Ser: 0.72 mg/dL (ref 0.57–1.00)
GFR calc Af Amer: 93 mL/min/{1.73_m2} (ref >59)
GFR calc non Af Amer: 80 mL/min/{1.73_m2} (ref >59)
Globulin, Total: 2.7 g/dL (ref 1.5–4.5)
Glucose: 95 mg/dL (ref 65–99)
Potassium: 4.4 mmol/L (ref 3.5–5.2)
Sodium: 145 mmol/L — ABNORMAL HIGH (ref 134–144)
Total Protein: 6.8 g/dL (ref 6.0–8.5)

## 2017-12-16 LAB — URINE CULTURE

## 2017-12-19 ENCOUNTER — Telehealth: Payer: Self-pay | Admitting: Urgent Care

## 2017-12-19 ENCOUNTER — Telehealth: Payer: Self-pay

## 2017-12-19 NOTE — Telephone Encounter (Signed)
Pts  Daughter    Called requesting  Lab  Results   No  Result  Note      No  Crm      Pt  conference   In   With  chanda   From [place]

## 2017-12-19 NOTE — Telephone Encounter (Signed)
Copied from [place]. Topic: Quick Communication - See Telephone Encounter >> Dec 19, 2017 12:54 PM Cl[place], Laban Emperor wrote: CRM for notification. See Telephone encounter for:  Pt daughter Gwen Pounds was calling for pt lab results, please call her at [phone].  12/19/17.

## 2017-12-26 ENCOUNTER — Encounter: Payer: Self-pay | Admitting: Family Medicine

## 2017-12-26 ENCOUNTER — Ambulatory Visit (INDEPENDENT_AMBULATORY_CARE_PROVIDER_SITE_OTHER): Payer: PPO | Admitting: Family Medicine

## 2017-12-26 VITALS — BP 122/76 | HR 81 | Temp 98.3°F | Resp 12 | Ht 61.0 in | Wt 110.5 lb

## 2017-12-26 DIAGNOSIS — E785 Hyperlipidemia, unspecified: Secondary | ICD-10-CM

## 2017-12-26 DIAGNOSIS — E870 Hyperosmolality and hypernatremia: Secondary | ICD-10-CM | POA: Diagnosis not present

## 2017-12-26 DIAGNOSIS — H8110 Benign paroxysmal vertigo, unspecified ear: Secondary | ICD-10-CM

## 2017-12-26 DIAGNOSIS — I10 Essential (primary) hypertension: Secondary | ICD-10-CM

## 2017-12-26 LAB — CBC
HCT: 41 % (ref 36.0–46.0)
Hemoglobin: 13.4 g/dL (ref 12.0–15.0)
MCHC: 32.6 g/dL (ref 30.0–36.0)
MCV: 93.6 fl (ref 78.0–100.0)
Platelets: 271 10*3/uL (ref 150.0–400.0)
RBC: 4.38 Mil/uL (ref 3.87–5.11)
RDW: 13.3 % (ref 11.5–15.5)
WBC: 6.4 10*3/uL (ref 4.0–10.5)

## 2017-12-26 LAB — BASIC METABOLIC PANEL
BUN: 23 mg/dL (ref 6–23)
CO2: 31 mEq/L (ref 19–32)
Calcium: 9.7 mg/dL (ref 8.4–10.5)
Chloride: 104 mEq/L (ref 96–112)
Creatinine, Ser: 0.75 mg/dL (ref 0.40–1.20)
GFR: 79.38 mL/min (ref >60.00)
Glucose, Bld: 81 mg/dL (ref 70–99)
Potassium: 4.3 mEq/L (ref 3.5–5.1)
Sodium: 142 mEq/L (ref 135–145)

## 2017-12-26 LAB — LIPID PANEL
Cholesterol: 226 mg/dL — ABNORMAL HIGH (ref 0–200)
HDL: 72.1 mg/dL (ref >39.00)
LDL Cholesterol: 135 mg/dL — ABNORMAL HIGH (ref 0–99)
NonHDL: 154.26
Total CHOL/HDL Ratio: 3
Triglycerides: 94 mg/dL (ref 0.0–149.0)
VLDL: 18.8 mg/dL (ref 0.0–40.0)

## 2017-12-26 NOTE — Progress Notes (Signed)
ACUTE VISIT   HPI:  Chief Complaint  Patient presents with  . Follow-up    [name] is a 77 y.o. female, who is here today with her friend complaining of intermittent episodes of dizzy spells since 12/11/17, which started when she was in bed around 3:00 Am. Initial episode she felt like she was spinning but subsequent episodes she has felt like she is in a boat.  3 days ago she started again with dizziness, it has improved. She has not had falls. No associated headache, visual changes, nausea,vomiting,hearing loss,or tinnitus. Exacerbated by turning in bed and head movement. She was seen on 12/15/17 and prescribed Meclizine,which she felt like exacerbated problem. According to pt,she was Dx with UTI and also prescribed abx. Denies dysuria,increased urinary frequency, gross hematuria,or decreased urine output.  "Minimal" cough,"once in a while." Denies dyspnea,chest pain, palpitations,or diaphoresis.  She has been treated for vertigo a few years ago.  Work-up done on 12/15/17 otherwise neg except for Na++ 145.  She wonders if she needs to go to see ENT.  HTN:  She is also concerned about one BP reading elevated, 12/15/17 BP was elevated at 178/90. Rest of days BP's 120's/60's. Currently on non pharmacologic treatment. She follows a healthy low salt diet.  She is also requesting lipid panel done today.  Hyperlipidemia:  Currently on non pharmacologic treatment. Following a low fat diet: Yes..  She was on statin treatment before, which she tolerated well.   Lab Results  Component Value Date   CHOL 214 (H) 03/19/2017   HDL 67.50 03/19/2017   LDLCALC 126 (H) 03/19/2017   TRIG 105.0 03/19/2017   CHOLHDL 3 03/19/2017   She also is upset because I did not sent medication for finger nail deformity, which is chronic.She has been treated with antimycotics with no response.She has also seen dermatologist,who referred her to [facility]. She has appt  01/13/2018. Problem has been stable.   Review of Systems  Constitutional: Negative for activity change, appetite change, fatigue, fever and unexpected weight change.  HENT: Negative for congestion, ear discharge, ear pain, facial swelling, hearing loss, mouth sores, nosebleeds, rhinorrhea, sneezing, sore throat, trouble swallowing and voice change.   Eyes: Negative for photophobia, pain and visual disturbance.  Respiratory: Negative for cough, shortness of breath and wheezing.   Cardiovascular: Negative for chest pain, palpitations and leg swelling.  Gastrointestinal: Negative for abdominal pain, nausea and vomiting.       No changes in bowel habits.  Endocrine: Negative for polydipsia, polyphagia and polyuria.  Genitourinary: Negative for decreased urine volume, dysuria, frequency and hematuria.  Musculoskeletal: Negative for gait problem and myalgias.  Skin: Negative for color change and rash.  Allergic/Immunologic: Negative for environmental allergies.  Neurological: Positive for dizziness. Negative for tremors, syncope, speech difficulty, weakness, numbness and headaches.  Hematological: Negative for adenopathy. Does not bruise/bleed easily.  Psychiatric/Behavioral: Negative for confusion and sleep disturbance. The patient is nervous/anxious.       Current Outpatient Medications on File Prior to Visit  Medication Sig Dispense Refill  . meclizine (ANTIVERT) 25 MG tablet Take 1 tablet (25 mg total) by mouth 2 (two) times daily. (Patient not taking: Reported on 12/26/2017) 30 tablet 0  . ondansetron (ZOFRAN-ODT) 8 MG disintegrating tablet Take 1 tablet (8 mg total) by mouth every 8 (eight) hours as needed for nausea. (Patient not taking: Reported on 12/26/2017) 15 tablet 0   No current facility-administered medications on file prior to visit.  Past Medical History:  Diagnosis Date  . Cervical disc disorder at C5-C6 level with radiculopathy   . Chicken pox   . Hyperlipidemia     . Hypertension   . Osteoporosis    DEXA 01/2012,she was on Fosamax and Evista.   Allergies  Allergen Reactions  . Ultram [Tramadol Hcl] Nausea And Vomiting    Social History   Socioeconomic History  . Marital status: Widowed    Spouse name: None  . Number of children: None  . Years of education: None  . Highest education level: None  Social Needs  . Financial resource strain: None  . Food insecurity - worry: None  . Food insecurity - inability: None  . Transportation needs - medical: None  . Transportation needs - non-medical: None  Occupational History  . None  Tobacco Use  . Smoking status: Never Smoker  . Smokeless tobacco: Never Used  Substance and Sexual Activity  . Alcohol use: No  . Drug use: No  . Sexual activity: None  Other Topics Concern  . None  Social History Narrative  . None    Vitals:   12/26/17 0816  BP: 122/76  Pulse: 81  Resp: 12  Temp: 98.3 F (36.8 C)  SpO2: 100%   Body mass index is 20.88 kg/m.   Physical Exam  Nursing note and vitals reviewed. Constitutional: She is oriented to person, place, and time. She appears well-developed. She does not appear ill. No distress.  HENT:  Head: Normocephalic and atraumatic.  Right Ear: Hearing, tympanic membrane, external ear and ear canal normal.  Left Ear: Hearing, tympanic membrane, external ear and ear canal normal.  Mouth/Throat: Oropharynx is clear and moist and mucous membranes are normal.  Apley maneuver pos Nystagmus pos  Eyes: Conjunctivae are normal. Pupils are equal, round, and reactive to light.  Neck: No JVD present. Carotid bruit is not present. No tracheal deviation present. No thyromegaly present.  Cardiovascular: Normal rate and regular rhythm.  No murmur heard. Pulses:      Dorsalis pedis pulses are 2+ on the right side, and 2+ on the left side.  Respiratory: Effort normal and breath sounds normal. No respiratory distress.  GI: Soft. She exhibits no mass. There is no  hepatomegaly. There is no tenderness.  Musculoskeletal: She exhibits no edema or tenderness.  Lymphadenopathy:    She has no cervical adenopathy.  Neurological: She is alert and oriented to person, place, and time. She has normal strength. No cranial nerve deficit or sensory deficit. She displays a negative Romberg sign. Coordination and gait normal.  Reflex Scores:      Bicep reflexes are 2+ on the right side and 2+ on the left side.      Patellar reflexes are 2+ on the right side and 2+ on the left side. Pronator drift negative.  Skin: Skin is warm. No rash noted. No erythema.  Psychiatric: Her mood appears anxious. Cognition and memory are normal.  Well groomed, good eye contact.    ASSESSMENT AND PLAN:   [name] was seen today for follow-up.  Diagnoses and all orders for this visit:  Lab Results  Component Value Date   CHOL 226 (H) 12/26/2017   HDL 72.10 12/26/2017   LDLCALC 135 (H) 12/26/2017   TRIG 94.0 12/26/2017   CHOLHDL 3 12/26/2017   Lab Results  Component Value Date   WBC 6.4 12/26/2017   HGB 13.4 12/26/2017   HCT 41.0 12/26/2017   MCV 93.6 12/26/2017   PLT  271.0 12/26/2017   Lab Results  Component Value Date   CREATININE 0.75 12/26/2017   BUN 23 12/26/2017   NA 142 12/26/2017   K 4.3 12/26/2017   CL 104 12/26/2017   CO2 31 12/26/2017    Benign paroxysmal positional vertigo, unspecified laterality  We discussed other possible etiologies of dizziness, Hx and examination today suggest benign vertigo.  Explained that problem can be recurrent. Fall prevention. Options: ENT referral,vestibular PT referral. She would like to try PT first.  Meclizine 25 mg, recommend trying again but lower dose and tid prn, side effects discussed. Instructed about warning signs. F/U as needed.  -     Ambulatory referral to Physical Therapy  Hyperlipidemia, unspecified hyperlipidemia type  Continue low-fat diet. Further recommendation will be given according to  lab results. Follow-up in 6-12 months.  The 10-year ASCVD risk score Mikey Bussing DC Brooke Bonito., et al., 2013) is: 20.7%   Values used to calculate the score:     Age: 1 years     Sex: Female     Is Non-Hispanic African American: No     Diabetic: No     Tobacco smoker: No     Systolic Blood Pressure: 387 mmHg     Is BP treated: No     HDL Cholesterol: 72.1 mg/dL     Total Cholesterol: 226 mg/dL  -     Lipid panel  Essential hypertension, benign  Adequately controlled otherwise. No changes in current management, continue non pharmacologic treatment.  DASH-low salt diet to continue. Continue monitoring BP at home.  -     Basic metabolic panel -     CBC  Hypernatremia  Increase fluid intake. Further recommendations will be given according to lab results.    -[name] was advised to seek immediate medical attention if sudden worsening symptoms or to follow if they persist or if new concerns arise.       Kamel Haven G. Martinique, MD  [facility]. [place] office.

## 2017-12-26 NOTE — Patient Instructions (Addendum)
A few things to remember from today's visit:   Hyperlipidemia, unspecified hyperlipidemia type - Plan: Lipid panel  Essential hypertension, benign - Plan: Basic metabolic panel, CBC  Benign paroxysmal positional vertigo, unspecified laterality - Plan: Ambulatory referral to Physical Therapy  Hypernatremia   Dizziness is a perception of movement, it is sometimes difficult to describe and can be  caused by different problems, most benign but others can be life threaten.  Vertigo is the most common cause of dizziness, usually related with inner ear and can be associated with nausea, vomiting, and unbalance sensation. It can be complicated by falls due to lose of balance; so fall precautions are very important.  Most of the time dizziness is benign, usually intermittent, last a few seconds at the time and aggravated by certain positions. It usually resolves in a few weeks without residual effect but it could be recurrent.  Sometimes blood work is ordered to evaluate for other possible causes.  Dizziness can also be caused by certain medications, dehydration, migraines, and strokes.  Medication prescribed for vertigo, Meclizine, causes drowsiness/sleepiness, so frequently I recommended taking it at bedtime.    Seek immediate medical attention if: New severe headache, dobble vision, fever (100 F or more), associated numbness/tingling, focal weakness, persistent vomiting, not able to walk, or sudden worsening symptoms.   Please be sure medication list is accurate. If a new problem present, please set up appointment sooner than planned today.

## 2017-12-30 ENCOUNTER — Telehealth: Payer: Self-pay | Admitting: Family Medicine

## 2017-12-30 DIAGNOSIS — H8111 Benign paroxysmal vertigo, right ear: Secondary | ICD-10-CM | POA: Diagnosis not present

## 2017-12-30 NOTE — Telephone Encounter (Signed)
Copied from [place] [id number]. Topic: General - Other >> Dec 30, 2017 11:57 AM Lolita Rieger, RMA wrote: Reason for JQD:[facility] from [facility] called and would like the referral faxed to [phone] because they are not on Epic and can not view the referral please put attn: [facility]

## 2017-12-30 NOTE — Telephone Encounter (Signed)
CRM was already routed to referral coordinator.

## 2017-12-31 ENCOUNTER — Telehealth: Payer: Self-pay | Admitting: Family Medicine

## 2017-12-31 NOTE — Telephone Encounter (Signed)
Pt requesting lab results please.

## 2017-12-31 NOTE — Telephone Encounter (Signed)
Copied from [place] [id number]. Topic: Quick Communication - See Telephone Encounter >> Dec 31, 2017  3:27 PM Boyd Kerbs wrote: CRM for notification. See Telephone encounter for:   Patient is calling for results on blood test, she had lab work done last Friday. Has not heard anything.  Please call patient with results and also send copy in mail   12/31/17.

## 2017-12-31 NOTE — Telephone Encounter (Signed)
Dr. Wyn Quaker looked at lab results as of yet, soon as she does, patient will be called with results and copy will be mailed. Spoke with patient and she verbalized understanding.

## 2018-01-01 NOTE — Telephone Encounter (Signed)
Patient checking on the status of labs results, patient expressed she feels something is wrong that results have not came back yet, please advise # [phone]. Informed patient PCP is out of the office today.

## 2018-01-03 ENCOUNTER — Encounter: Payer: Self-pay | Admitting: Family Medicine

## 2018-01-13 DIAGNOSIS — H25811 Combined forms of age-related cataract, right eye: Secondary | ICD-10-CM | POA: Diagnosis not present

## 2018-01-13 DIAGNOSIS — H2511 Age-related nuclear cataract, right eye: Secondary | ICD-10-CM | POA: Diagnosis not present

## 2018-02-09 DIAGNOSIS — B351 Tinea unguium: Secondary | ICD-10-CM | POA: Diagnosis not present

## 2018-02-09 NOTE — Telephone Encounter (Signed)
Encounter created in error

## 2018-03-09 DIAGNOSIS — Z79899 Other long term (current) drug therapy: Secondary | ICD-10-CM | POA: Diagnosis not present

## 2018-03-23 ENCOUNTER — Other Ambulatory Visit: Payer: Self-pay | Admitting: Family Medicine

## 2018-03-23 DIAGNOSIS — Z1231 Encounter for screening mammogram for malignant neoplasm of breast: Secondary | ICD-10-CM

## 2018-04-15 ENCOUNTER — Other Ambulatory Visit: Payer: Self-pay | Admitting: Family Medicine

## 2018-04-15 ENCOUNTER — Ambulatory Visit
Admission: RE | Admit: 2018-04-15 | Discharge: 2018-04-15 | Disposition: A | Discharge status: Home / Self Care | Payer: PPO | Source: Ambulatory Visit | Attending: Family Medicine | Admitting: Family Medicine

## 2018-04-15 DIAGNOSIS — Z1231 Encounter for screening mammogram for malignant neoplasm of breast: Secondary | ICD-10-CM | POA: Diagnosis not present

## 2018-04-19 NOTE — Progress Notes (Signed)
HPI:   Gabriella Craig is a 77 y.o. female, who is here today for her routine physical.  Last CPE: 03/19/17.  Regular exercise 3 or more time per week: She is active around her house with chores and yard work. Following a healthy diet: Yes She lives alone, daughter lies close by.   Chronic medical problems: HTN and HLD on non pharmacologic treatment.  Right cataract surgery in 12/2017. Hx of dry eye.   Immunization History  Administered Date(s) Administered  . Tdap 12/17/2015  Zoster vaccine 10/2011.  Mammogram: 04/2018 ,Bi-Rads 1 Colonoscopy: 06/2015: Diverticulosis sigmoid colon and internal hemorrhoids. DEXA: 01/2012, last CPE she was not interested in further osteoporosis treatment. She took Fosamax for 8 years and Evista for a year.   She has no new concerns today.  HLD:  Lab Results  Component Value Date   CHOL 226 (H) 12/26/2017   HDL 72.10 12/26/2017   LDLCALC 135 (H) 12/26/2017   TRIG 94.0 12/26/2017   CHOLHDL 3 12/26/2017    HTN:  Home BP's: 120's-130/50's-70's.   Lab Results  Component Value Date   CREATININE 0.75 12/26/2017   BUN 23 12/26/2017   NA 142 12/26/2017   K 4.3 12/26/2017   CL 104 12/26/2017   CO2 31 12/26/2017   She is taking medication for fingernail onychomycosis. She follows with Dr Tamala Julian, dermatologist, next appointment in about 2 weeks. She has noted wt loss, attributed to antimycotic medication.  No changes in her diet or physical activity.   She thinks she should see a cardiologist. BP readings: 110-130/50-70. She is on pharmacologic treatment. No severe/frequent headache, visual changes, chest pain, dyspnea, palpitation, claudication, focal weakness, or edema.  She is also complaining about right knee achy pain, mild, exacerbated by prolonged walking or standing. Pain resolved with rest. No erythema or effusion. She does  Not take OTC medication.  -Also concerned about prominent veins in left leg.  No  edema, erythema, or skin lesions. Concerend about circulation problems.  Negative for LE pain. It seems to be stable.    Review of Systems  Constitutional: Negative for appetite change, fatigue and fever.  HENT: Negative for dental problem, hearing loss, mouth sores, sore throat and voice change.   Eyes: Negative for redness and visual disturbance.  Respiratory: Positive for cough (Occasional,non productive.Attributed to allergies.). Negative for shortness of breath and wheezing.   Cardiovascular: Negative for chest pain and leg swelling.  Gastrointestinal: Negative for abdominal pain, nausea and vomiting.       No changes in bowel habits.  Endocrine: Negative for cold intolerance, heat intolerance, polydipsia, polyphagia and polyuria.  Genitourinary: Negative for decreased urine volume, dysuria, hematuria, vaginal bleeding and vaginal discharge.  Musculoskeletal: Positive for arthralgias. Negative for gait problem, joint swelling and neck pain.  Skin: Negative for color change and rash.  Allergic/Immunologic: Positive for environmental allergies.  Neurological: Negative for syncope, weakness and headaches.  Hematological: Negative for adenopathy. Does not bruise/bleed easily.  Psychiatric/Behavioral: Negative for confusion and sleep disturbance. The patient is nervous/anxious.   All other systems reviewed and are negative.     Current Outpatient Medications on File Prior to Visit  Medication Sig Dispense Refill  . terbinafine (LAMISIL) 250 MG tablet Take 250 mg by mouth daily.  3  . meclizine (ANTIVERT) 25 MG tablet Take 1 tablet (25 mg total) by mouth 2 (two) times daily. (Patient not taking: Reported on 12/26/2017) 30 tablet 0  . ondansetron (ZOFRAN-ODT) 8 MG disintegrating  tablet Take 1 tablet (8 mg total) by mouth every 8 (eight) hours as needed for nausea. (Patient not taking: Reported on 12/26/2017) 15 tablet 0   No current facility-administered medications on file prior to  visit.      Past Medical History:  Diagnosis Date  . Cervical disc disorder at C5-C6 level with radiculopathy   . Chicken pox   . Hyperlipidemia   . Hypertension   . Osteoporosis    DEXA 01/2012,she was on Fosamax and Evista.    History reviewed. No pertinent surgical history.  Allergies  Allergen Reactions  . Ultram [Tramadol Hcl] Nausea And Vomiting    Family History  Problem Relation Age of Onset  . Cancer Father        esophageal  . Diabetes Sister   . Cancer Brother 35       colon  . Cancer Brother        lung    Social History   Socioeconomic History  . Marital status: Widowed    Spouse name: Not on file  . Number of children: Not on file  . Years of education: Not on file  . Highest education level: Not on file  Occupational History  . Not on file  Social Needs  . Financial resource strain: Not on file  . Food insecurity:    Worry: Not on file    Inability: Not on file  . Transportation needs:    Medical: Not on file    Non-medical: Not on file  Tobacco Use  . Smoking status: Never Smoker  . Smokeless tobacco: Never Used  Substance and Sexual Activity  . Alcohol use: No  . Drug use: No  . Sexual activity: Not on file  Lifestyle  . Physical activity:    Days per week: Not on file    Minutes per session: Not on file  . Stress: Not on file  Relationships  . Social connections:    Talks on phone: Not on file    Gets together: Not on file    Attends religious service: Not on file    Active member of club or organization: Not on file    Attends meetings of clubs or organizations: Not on file    Relationship status: Not on file  Other Topics Concern  . Not on file  Social History Narrative  . Not on file     Vitals:   04/20/18 0748  BP: 124/74  Pulse: 74  Resp: 12  Temp: 98.5 F (36.9 C)  SpO2: 99%   Body mass index is 19.53 kg/m.   Wt Readings from Last 3 Encounters:  04/20/18 103 lb 6 oz (46.9 kg)  12/26/17 110 lb 8 oz (50.1  kg)  03/19/17 107 lb 2 oz (48.6 kg)    Physical Exam  Nursing note and vitals reviewed. Constitutional: She is oriented to person, place, and time. She appears well-developed and well-nourished. No distress.  HENT:  Head: Normocephalic and atraumatic.  Right Ear: Hearing, tympanic membrane, external ear and ear canal normal.  Left Ear: Hearing, tympanic membrane, external ear and ear canal normal.  Mouth/Throat: Uvula is midline, oropharynx is clear and moist and mucous membranes are normal.  Eyes: Pupils are equal, round, and reactive to light. Conjunctivae and EOM are normal.  Neck: No tracheal deviation present. No thyromegaly present.  Cardiovascular: Normal rate and regular rhythm.  No murmur heard. Pulses:      Dorsalis pedis pulses are 2+ on the right  side, and 2+ on the left side.  Varicose veins LE, bilateral.No ulcers or edema.  Respiratory: Effort normal and breath sounds normal. No respiratory distress.  GI: Soft. She exhibits no mass. There is no hepatomegaly. There is no tenderness.  Musculoskeletal: She exhibits no edema.  Minimal limitation of right knee, mild crepitus, no pain with movement.  Lymphadenopathy:    She has no cervical adenopathy.  Neurological: She is alert and oriented to person, place, and time. She has normal strength. No cranial nerve deficit. Coordination and gait normal.  Reflex Scores:      Bicep reflexes are 2+ on the right side and 2+ on the left side.      Patellar reflexes are 2+ on the right side and 2+ on the left side. Skin: Skin is warm. No rash noted. No erythema.  Psychiatric: She has a normal mood and affect. Cognition and memory are normal.  Well groomed, good eye contact.    ASSESSMENT AND PLAN:  Gabriella Craig was here today annual physical examination.   Orders Placed This Encounter  Procedures  . DEXAScan  . Basic metabolic panel  . Lipid panel    Lab Results  Component Value Date   CHOL 231 (H) 04/20/2018    HDL 85.90 04/20/2018   LDLCALC 129 (H) 04/20/2018   TRIG 78.0 04/20/2018   CHOLHDL 3 04/20/2018   Lab Results  Component Value Date   CREATININE 0.77 04/20/2018   BUN 15 04/20/2018   NA 144 04/20/2018   K 4.0 04/20/2018   CL 107 04/20/2018   CO2 28 04/20/2018    Routine general medical examination at a health care facility  We discussed the importance of regular physical activity and healthy diet for prevention of chronic illness and/or complications. Preventive guidelines reviewed. Vaccination up to date. Ca++ and vit D supplementation to continue. Next CPE in a year.  The 10-year ASCVD risk score Mikey Bussing DC Brooke Bonito., et al., 2013) is: 21.8%   Values used to calculate the score:     Age: 66 years     Sex: Female     Is Non-Hispanic African American: No     Diabetic: No     Tobacco smoker: No     Systolic Blood Pressure: 973 mmHg     Is BP treated: No     HDL Cholesterol: 85.9 mg/dL     Total Cholesterol: 231 mg/dL  Asymptomatic postmenopausal estrogen deficiency -     DEXAScan; Future   Hyperlipidemia For now she will continue nonpharmacologic treatment. Further recommendations will be given according to lipid panel results. Follow-up in 6 to 12 months.  Essential hypertension, benign Well-controlled with nonpharmacologic treatment. She will continue monitoring BP at home. Continue low-salt diet. Follow-up in a year, before if needed.  Osteoporosis Fall prevention. Continue ca++ and vitamin D supplementation. Further recommendations will be given according to DEXA results.  Osteoarthritis of right knee, unspecified osteoarthritis type  Recommend avoiding activities that aggravate pain. OTC Icy hot or Asper cream with Lidocaine may help. Tylenol 325 mg qid if needed can also be considered.  Asymptomatic varicose veins of both lower extremities  Reassured. Compression stocking may help. Adequate skin care and trauma prevention.    She agrees on holding on  cardiology referral for now.    Return in about 1 year (around 04/21/2019) for CPE.        Betty G. Martinique, MD  [facility]. [place] office.

## 2018-04-20 ENCOUNTER — Ambulatory Visit (INDEPENDENT_AMBULATORY_CARE_PROVIDER_SITE_OTHER): Payer: PPO | Admitting: Family Medicine

## 2018-04-20 ENCOUNTER — Encounter: Payer: Self-pay | Admitting: Family Medicine

## 2018-04-20 VITALS — BP 124/74 | HR 74 | Temp 98.5°F | Resp 12 | Ht 61.0 in | Wt 103.4 lb

## 2018-04-20 DIAGNOSIS — M179 Osteoarthritis of knee, unspecified: Secondary | ICD-10-CM | POA: Insufficient documentation

## 2018-04-20 DIAGNOSIS — Z780 Asymptomatic menopausal state: Secondary | ICD-10-CM | POA: Diagnosis not present

## 2018-04-20 DIAGNOSIS — Z0000 Encounter for general adult medical examination without abnormal findings: Secondary | ICD-10-CM

## 2018-04-20 DIAGNOSIS — M1711 Unilateral primary osteoarthritis, right knee: Secondary | ICD-10-CM | POA: Diagnosis not present

## 2018-04-20 DIAGNOSIS — I10 Essential (primary) hypertension: Secondary | ICD-10-CM

## 2018-04-20 DIAGNOSIS — M810 Age-related osteoporosis without current pathological fracture: Secondary | ICD-10-CM

## 2018-04-20 DIAGNOSIS — E785 Hyperlipidemia, unspecified: Secondary | ICD-10-CM | POA: Diagnosis not present

## 2018-04-20 DIAGNOSIS — I8393 Asymptomatic varicose veins of bilateral lower extremities: Secondary | ICD-10-CM

## 2018-04-20 DIAGNOSIS — M1710 Unilateral primary osteoarthritis, unspecified knee: Secondary | ICD-10-CM | POA: Insufficient documentation

## 2018-04-20 DIAGNOSIS — Z0001 Encounter for general adult medical examination with abnormal findings: Secondary | ICD-10-CM

## 2018-04-20 LAB — LIPID PANEL
Cholesterol: 231 mg/dL — ABNORMAL HIGH (ref 0–200)
HDL: 85.9 mg/dL (ref >39.00)
LDL Cholesterol: 129 mg/dL — ABNORMAL HIGH (ref 0–99)
NonHDL: 145.01
Total CHOL/HDL Ratio: 3
Triglycerides: 78 mg/dL (ref 0.0–149.0)
VLDL: 15.6 mg/dL (ref 0.0–40.0)

## 2018-04-20 LAB — BASIC METABOLIC PANEL
BUN: 15 mg/dL (ref 6–23)
CO2: 28 mEq/L (ref 19–32)
Calcium: 9.5 mg/dL (ref 8.4–10.5)
Chloride: 107 mEq/L (ref 96–112)
Creatinine, Ser: 0.77 mg/dL (ref 0.40–1.20)
GFR: 76.94 mL/min (ref >60.00)
Glucose, Bld: 88 mg/dL (ref 70–99)
Potassium: 4 mEq/L (ref 3.5–5.1)
Sodium: 144 mEq/L (ref 135–145)

## 2018-04-20 NOTE — Patient Instructions (Addendum)
A few things to remember from today's visit:   Routine general medical examination at a health care facility  Hyperlipidemia, unspecified hyperlipidemia type - Plan: Lipid panel  Essential hypertension, benign - Plan: Basic metabolic panel  Osteoporosis, unspecified osteoporosis type, unspecified pathological fracture presence  Asymptomatic postmenopausal estrogen deficiency - Plan: DEXAScan   A few tips:  -As we age balance is not as good as it was, so there is a higher risks for falls. Please remove small rugs and furniture that is "in your way" and could increase the risk of falls. Stretching exercises may help with fall prevention: Yoga and Tai Chi are some examples. Low impact exercise is better, so you are not very achy the next day.  -Sun screen and avoidance of direct sun light recommended. Caution with dehydration, if working outdoors be sure to drink enough fluids.  - Some medications are not safe as we age, increases the risk of side effects and can potentially interact with other medication you are also taken;  including some of over the counter medications. Be sure to let me know when you start a new medication even if it is a dietary/vitamin supplement.   -Healthy diet low in red meet/animal fat and sugar + regular physical activity is recommended.       Screening schedule for the next 5-10 years:  Glaucoma screening/eye exam every 1-2 years.  Flu vaccine annually.  Diabetes screening   Fall prevention   Advance directives:  Please see a lawyer and/or go to this website to help you with advanced directives and designating a health care power of attorney so that your wishes will be followed should you become too ill to make your own medical decisions.  [email or web address]       Please be sure medication list is accurate. If a new problem present, please set up appointment sooner than planned today.

## 2018-04-20 NOTE — Assessment & Plan Note (Signed)
Well-controlled with nonpharmacologic treatment. She will continue monitoring BP at home. Continue low-salt diet. Follow-up in a year, before if needed.

## 2018-04-20 NOTE — Assessment & Plan Note (Signed)
For now she will continue nonpharmacologic treatment. Further recommendations will be given according to lipid panel results. Follow-up in 6 to 12 months.

## 2018-04-20 NOTE — Assessment & Plan Note (Signed)
Fall prevention. Continue ca++ and vitamin D supplementation. Further recommendations will be given according to DEXA results.

## 2018-04-24 ENCOUNTER — Telehealth: Payer: Self-pay | Admitting: Family Medicine

## 2018-04-24 ENCOUNTER — Encounter: Payer: Self-pay | Admitting: *Deleted

## 2018-04-24 ENCOUNTER — Encounter: Payer: Self-pay | Admitting: Family Medicine

## 2018-04-24 NOTE — Telephone Encounter (Signed)
Copied from [place] [id number]. Topic: Quick Communication - Lab Results >> Apr 24, 2018  1:42 PM Tye [place] wrote: Pt would like labs to be mailed to her, call pt if needed

## 2018-04-24 NOTE — Telephone Encounter (Signed)
Results mailed to address on file as requested. 

## 2018-04-29 NOTE — Telephone Encounter (Signed)
Patient is calling and would this remailed because she states she has not received her results yet.

## 2018-05-01 ENCOUNTER — Encounter: Payer: Self-pay | Admitting: Family Medicine

## 2018-05-01 NOTE — Telephone Encounter (Signed)
Results printed again and put in outgoing mail on 05/01/18, mailed to address on file.

## 2018-05-01 NOTE — Progress Notes (Signed)
Kidney function normal.  -Glucose (sugar) is in normal range.  -Cholesterol still mildly elevated, total cholesterol went up from 226 to 231, LDL went from 135 to 129.  Your good cholesterol (HDL) is still good.   Based on your 10 years cardiovascular risk score, you will benefit from cholesterol medication. We could try low-dose of Lovastatin and follow in 4 to 6 months.  Continue low-fat diet and regular physical activity.

## 2018-05-13 DIAGNOSIS — B351 Tinea unguium: Secondary | ICD-10-CM | POA: Diagnosis not present

## 2018-05-28 DIAGNOSIS — H16223 Keratoconjunctivitis sicca, not specified as Sjogren's, bilateral: Secondary | ICD-10-CM | POA: Diagnosis not present

## 2018-06-09 ENCOUNTER — Ambulatory Visit
Admission: RE | Admit: 2018-06-09 | Discharge: 2018-06-09 | Disposition: A | Discharge status: Home / Self Care | Payer: PPO | Source: Ambulatory Visit | Attending: Family Medicine | Admitting: Family Medicine

## 2018-06-09 DIAGNOSIS — Z780 Asymptomatic menopausal state: Secondary | ICD-10-CM | POA: Diagnosis not present

## 2018-06-09 DIAGNOSIS — M8589 Other specified disorders of bone density and structure, multiple sites: Secondary | ICD-10-CM | POA: Diagnosis not present

## 2018-06-15 ENCOUNTER — Encounter: Payer: Self-pay | Admitting: Family Medicine

## 2018-06-16 NOTE — Telephone Encounter (Signed)
Pt requesting call back with results and recommendations - does not do well on the computer.  Also would like it all mailed to her home address.   Please advise.

## 2018-06-16 NOTE — Telephone Encounter (Signed)
Spoke with patient and gave bone density scan results and instructions. Patient verbalized understanding.

## 2018-06-16 NOTE — Telephone Encounter (Signed)
Copied from [place] [id number]. Topic: Referral - Status >> Apr 27, 2018  4:38 PM Neva Seat wrote: Pt needing to know the status of her bone density referral.  She hasn't heard anything yet on the appt.  >> Apr 28, 2018  9:54 AM Nimmons, Emilio Math, RN wrote: The order is in epic, left a voice message for pt to call [facility] at [phone] to schedule scan.  >> Jun 16, 2018  2:41 PM Keene Breath wrote: Patient call to get results of her bone density test.  She stated that they sent her an e-mail message for her to look at through [facility], but she is not familiar with the computer and would like it mailed to her or a phone call from the nurse.  CB# [phone].

## 2018-10-20 ENCOUNTER — Ambulatory Visit (INDEPENDENT_AMBULATORY_CARE_PROVIDER_SITE_OTHER): Payer: PPO | Admitting: *Deleted

## 2018-10-20 DIAGNOSIS — Z23 Encounter for immunization: Secondary | ICD-10-CM

## 2018-11-18 ENCOUNTER — Ambulatory Visit (INDEPENDENT_AMBULATORY_CARE_PROVIDER_SITE_OTHER): Payer: PPO | Admitting: Family Medicine

## 2018-11-18 ENCOUNTER — Encounter: Payer: Self-pay | Admitting: Family Medicine

## 2018-11-18 VITALS — BP 133/77 | HR 101 | Temp 98.6°F | Resp 16 | Ht 61.0 in | Wt 105.0 lb

## 2018-11-18 DIAGNOSIS — J180 Bronchopneumonia, unspecified organism: Secondary | ICD-10-CM | POA: Diagnosis not present

## 2018-11-18 DIAGNOSIS — J069 Acute upper respiratory infection, unspecified: Secondary | ICD-10-CM | POA: Diagnosis not present

## 2018-11-18 MED ORDER — AMOXICILLIN 500 MG PO CAPS: 1000.0000 mg | ORAL_CAPSULE | Freq: Three times a day (TID) | ORAL | 0 refills | Status: AC

## 2018-11-18 MED ORDER — PREDNISONE 20 MG PO TABS: ORAL_TABLET | ORAL | 0 refills | Status: DC

## 2018-11-18 NOTE — Progress Notes (Signed)
OFFICE VISIT  11/18/2018   CC:  Chief Complaint  Patient presents with  . URI    started approx 1 wk ago, progessively gotten worse, no fever, fatigue   HPI:    Patient is a 77 y.o.  female who presents accompanied by her daughter for respiratory symptoms. Started with a lot of coughing 7 d/a, fatigue, ST, nasal congestion/runny nose.  No body aches.  Hurts in chest to cough. Delsym helping.  Cough is starting to be productive.  Voice is hoarse lately with this. Drinking fluids well.  Solids intake not so good.  No n/v.   Past Medical History:  Diagnosis Date  . Cervical disc disorder at C5-C6 level with radiculopathy   . Chicken pox   . Hyperlipidemia   . Hypertension   . Osteoporosis    DEXA 01/2012,she was on Fosamax and Evista.    History reviewed. No pertinent surgical history.  Outpatient Medications Prior to Visit  Medication Sig Dispense Refill  . meclizine (ANTIVERT) 25 MG tablet Take 1 tablet (25 mg total) by mouth 2 (two) times daily. (Patient not taking: Reported on 12/26/2017) 30 tablet 0  . ondansetron (ZOFRAN-ODT) 8 MG disintegrating tablet Take 1 tablet (8 mg total) by mouth every 8 (eight) hours as needed for nausea. (Patient not taking: Reported on 12/26/2017) 15 tablet 0  . terbinafine (LAMISIL) 250 MG tablet Take 250 mg by mouth daily.  3   No facility-administered medications prior to visit.     Allergies  Allergen Reactions  . Ultram [Tramadol Hcl] Nausea And Vomiting    ROS As per HPI  PE: Blood pressure 133/77, pulse (!) 101, temperature 98.6 F (37 C), temperature source Oral, resp. rate 16, height 5\' 1"  (1.549 m), weight 105 lb (47.6 kg), SpO2 99 %. VS: noted--normal. Gen: alert, NAD, NONTOXIC APPEARING. HEENT: eyes without injection, drainage, or swelling.  Ears: EACs clear, TMs with normal light reflex and landmarks.  Nose: Clear rhinorrhea, with some dried, crusty exudate adherent to mildly injected mucosa.  No purulent d/c.  No paranasal  sinus TTP.  No facial swelling.  Throat and mouth without focal lesion.  No pharyngial swelling, erythema, or exudate.   Neck: supple, no LAD.   LUNGS: CTA bilat, nonlabored resps.   CV: RRR, no m/r/g. EXT: no c/c/e SKIN: no rash  LABS:    Chemistry      Component Value Date/Time   NA 144 04/20/2018 0830   NA 145 (H) 12/15/2017 1542   K 4.0 04/20/2018 0830   CL 107 04/20/2018 0830   CO2 28 04/20/2018 0830   BUN 15 04/20/2018 0830   BUN 22 12/15/2017 1542   CREATININE 0.77 04/20/2018 0830      Component Value Date/Time   CALCIUM 9.5 04/20/2018 0830   ALKPHOS 80 12/15/2017 1542   AST 16 12/15/2017 1542   ALT 12 12/15/2017 1542   BILITOT 0.3 12/15/2017 1542      IMPRESSION AND PLAN:  Prolonged URI with cough getting worse/productive of more purulent mucous. Possible bacterial bronchopneumonia. Amoxil 1g tid x 10d. Prednisone 40mg  qd x 5d, then 20mg  qd x 5d. Push fluids, rest, continue delsym prn.  An After Visit Summary was printed and given to the patient.  FOLLOW UP: Return if symptoms worsen or fail to improve.  Signed:  Crissie Sickles, MD           11/18/2018

## 2018-12-16 DIAGNOSIS — M47812 Spondylosis without myelopathy or radiculopathy, cervical region: Secondary | ICD-10-CM

## 2018-12-16 HISTORY — DX: Spondylosis without myelopathy or radiculopathy, cervical region: M47.812

## 2018-12-30 ENCOUNTER — Ambulatory Visit (INDEPENDENT_AMBULATORY_CARE_PROVIDER_SITE_OTHER): Payer: PPO | Admitting: Family Medicine

## 2018-12-30 ENCOUNTER — Encounter: Payer: Self-pay | Admitting: Family Medicine

## 2018-12-30 VITALS — BP 134/81 | HR 84 | Temp 97.8°F | Resp 16 | Ht 61.0 in | Wt 107.2 lb

## 2018-12-30 DIAGNOSIS — G3184 Mild cognitive impairment, so stated: Secondary | ICD-10-CM | POA: Diagnosis not present

## 2018-12-30 DIAGNOSIS — M5412 Radiculopathy, cervical region: Secondary | ICD-10-CM | POA: Diagnosis not present

## 2018-12-30 DIAGNOSIS — R404 Transient alteration of awareness: Secondary | ICD-10-CM

## 2018-12-30 DIAGNOSIS — R258 Other abnormal involuntary movements: Secondary | ICD-10-CM | POA: Diagnosis not present

## 2018-12-30 DIAGNOSIS — R209 Unspecified disturbances of skin sensation: Secondary | ICD-10-CM | POA: Diagnosis not present

## 2018-12-30 DIAGNOSIS — M79602 Pain in left arm: Secondary | ICD-10-CM | POA: Diagnosis not present

## 2018-12-30 DIAGNOSIS — IMO0001 Reserved for inherently not codable concepts without codable children: Secondary | ICD-10-CM

## 2018-12-30 DIAGNOSIS — E7800 Pure hypercholesterolemia, unspecified: Secondary | ICD-10-CM

## 2018-12-30 LAB — LIPID PANEL
Cholesterol: 232 mg/dL — ABNORMAL HIGH (ref 0–200)
HDL: 80.4 mg/dL (ref >39.00)
LDL Cholesterol: 130 mg/dL — ABNORMAL HIGH (ref 0–99)
NonHDL: 151.23
Total CHOL/HDL Ratio: 3
Triglycerides: 106 mg/dL (ref 0.0–149.0)
VLDL: 21.2 mg/dL (ref 0.0–40.0)

## 2018-12-30 LAB — CBC WITH DIFFERENTIAL/PLATELET
Basophils Absolute: 0.1 10*3/uL (ref 0.0–0.1)
Basophils Relative: 0.8 % (ref 0.0–3.0)
Eosinophils Absolute: 0 10*3/uL (ref 0.0–0.7)
Eosinophils Relative: 0.5 % (ref 0.0–5.0)
HCT: 39.7 % (ref 36.0–46.0)
Hemoglobin: 13.2 g/dL (ref 12.0–15.0)
Lymphocytes Relative: 20.4 % (ref 12.0–46.0)
Lymphs Abs: 1.4 10*3/uL (ref 0.7–4.0)
MCHC: 33.2 g/dL (ref 30.0–36.0)
MCV: 92.8 fl (ref 78.0–100.0)
Monocytes Absolute: 0.3 10*3/uL (ref 0.1–1.0)
Monocytes Relative: 4.5 % (ref 3.0–12.0)
Neutro Abs: 5 10*3/uL (ref 1.4–7.7)
Neutrophils Relative %: 73.8 % (ref 43.0–77.0)
Platelets: 258 10*3/uL (ref 150.0–400.0)
RBC: 4.28 Mil/uL (ref 3.87–5.11)
RDW: 13.6 % (ref 11.5–15.5)
WBC: 6.7 10*3/uL (ref 4.0–10.5)

## 2018-12-30 LAB — COMPREHENSIVE METABOLIC PANEL
ALT: 16 U/L (ref 0–35)
AST: 21 U/L (ref 0–37)
Albumin: 4 g/dL (ref 3.5–5.2)
Alkaline Phosphatase: 59 U/L (ref 39–117)
BUN: 22 mg/dL (ref 6–23)
CO2: 28 mEq/L (ref 19–32)
Calcium: 9.8 mg/dL (ref 8.4–10.5)
Chloride: 106 mEq/L (ref 96–112)
Creatinine, Ser: 0.71 mg/dL (ref 0.40–1.20)
GFR: 84.34 mL/min (ref >60.00)
Glucose, Bld: 77 mg/dL (ref 70–99)
Potassium: 4.6 mEq/L (ref 3.5–5.1)
Sodium: 141 mEq/L (ref 135–145)
Total Bilirubin: 0.5 mg/dL (ref 0.2–1.2)
Total Protein: 6.7 g/dL (ref 6.0–8.3)

## 2018-12-30 LAB — TSH: TSH: 3.2 u[IU]/mL (ref 0.35–4.50)

## 2018-12-30 LAB — VITAMIN B12: Vitamin B-12: 783 pg/mL (ref 211–911)
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
## 2018-12-31 LAB — HIV ANTIBODY (ROUTINE TESTING W REFLEX): HIV 1&2 Ab, 4th Generation: NONREACTIVE

## 2018-12-31 LAB — RPR: RPR Ser Ql: NONREACTIVE

## 2019-01-07 ENCOUNTER — Ambulatory Visit
Admission: RE | Admit: 2019-01-07 | Discharge: 2019-01-07 | Disposition: A | Discharge status: Home / Self Care | Payer: PPO | Source: Ambulatory Visit | Attending: Family Medicine | Admitting: Family Medicine

## 2019-01-07 DIAGNOSIS — IMO0001 Reserved for inherently not codable concepts without codable children: Secondary | ICD-10-CM

## 2019-01-07 DIAGNOSIS — M5412 Radiculopathy, cervical region: Secondary | ICD-10-CM

## 2019-01-07 DIAGNOSIS — M4802 Spinal stenosis, cervical region: Secondary | ICD-10-CM | POA: Diagnosis not present

## 2019-01-07 DIAGNOSIS — R209 Unspecified disturbances of skin sensation: Principal | ICD-10-CM

## 2019-01-07 DIAGNOSIS — M79602 Pain in left arm: Secondary | ICD-10-CM

## 2019-01-10 ENCOUNTER — Encounter: Payer: Self-pay | Admitting: Family Medicine

## 2019-01-11 ENCOUNTER — Encounter: Payer: Self-pay | Admitting: *Deleted

## 2019-01-12 ENCOUNTER — Other Ambulatory Visit: Payer: Self-pay | Admitting: Family Medicine

## 2019-01-12 DIAGNOSIS — M542 Cervicalgia: Principal | ICD-10-CM

## 2019-01-12 DIAGNOSIS — G8929 Other chronic pain: Secondary | ICD-10-CM

## 2019-01-12 DIAGNOSIS — M47812 Spondylosis without myelopathy or radiculopathy, cervical region: Secondary | ICD-10-CM

## 2019-01-12 DIAGNOSIS — M4802 Spinal stenosis, cervical region: Secondary | ICD-10-CM

## 2019-01-15 ENCOUNTER — Ambulatory Visit (INDEPENDENT_AMBULATORY_CARE_PROVIDER_SITE_OTHER): Payer: PPO | Admitting: Family Medicine

## 2019-01-15 ENCOUNTER — Encounter: Payer: Self-pay | Admitting: Family Medicine

## 2019-01-15 VITALS — BP 160/78 | HR 73 | Temp 98.2°F | Resp 16 | Ht 61.0 in | Wt 108.5 lb

## 2019-01-15 DIAGNOSIS — M4802 Spinal stenosis, cervical region: Secondary | ICD-10-CM

## 2019-01-15 DIAGNOSIS — M47812 Spondylosis without myelopathy or radiculopathy, cervical region: Secondary | ICD-10-CM | POA: Diagnosis not present

## 2019-01-15 DIAGNOSIS — R202 Paresthesia of skin: Secondary | ICD-10-CM
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
## 2019-02-14 DIAGNOSIS — M7542 Impingement syndrome of left shoulder: Secondary | ICD-10-CM

## 2019-02-14 DIAGNOSIS — M19019 Primary osteoarthritis, unspecified shoulder: Secondary | ICD-10-CM

## 2019-02-14 HISTORY — DX: Primary osteoarthritis, unspecified shoulder: M19.019

## 2019-02-14 HISTORY — DX: Impingement syndrome of left shoulder: M75.42

## 2019-02-15 DIAGNOSIS — M19012 Primary osteoarthritis, left shoulder: Secondary | ICD-10-CM | POA: Diagnosis not present

## 2019-02-15 DIAGNOSIS — M25512 Pain in left shoulder: Secondary | ICD-10-CM | POA: Diagnosis not present

## 2019-02-15 DIAGNOSIS — M7542 Impingement syndrome of left shoulder: Secondary | ICD-10-CM | POA: Diagnosis not present

## 2019-02-17 DIAGNOSIS — M7542 Impingement syndrome of left shoulder: Secondary | ICD-10-CM | POA: Diagnosis not present

## 2019-02-18 DIAGNOSIS — R202 Paresthesia of skin: Secondary | ICD-10-CM | POA: Diagnosis not present

## 2019-02-18 DIAGNOSIS — M5030 Other cervical disc degeneration, unspecified cervical region: Secondary | ICD-10-CM | POA: Diagnosis not present

## 2019-02-19 DIAGNOSIS — M7542 Impingement syndrome of left shoulder: Secondary | ICD-10-CM | POA: Diagnosis not present

## 2019-02-24 DIAGNOSIS — M7542 Impingement syndrome of left shoulder: Secondary | ICD-10-CM | POA: Diagnosis not present

## 2019-02-28 ENCOUNTER — Encounter: Payer: Self-pay | Admitting: Family Medicine

## 2019-06-01 ENCOUNTER — Other Ambulatory Visit: Payer: Self-pay | Admitting: Family Medicine

## 2019-06-01 DIAGNOSIS — Z1231 Encounter for screening mammogram for malignant neoplasm of breast: Secondary | ICD-10-CM

## 2019-06-22 DIAGNOSIS — H9201 Otalgia, right ear: Secondary | ICD-10-CM | POA: Diagnosis not present

## 2019-06-28 DIAGNOSIS — M25512 Pain in left shoulder: Secondary | ICD-10-CM | POA: Diagnosis not present

## 2019-07-01 ENCOUNTER — Encounter: Payer: Self-pay | Admitting: Family Medicine

## 2019-07-07 DIAGNOSIS — M25512 Pain in left shoulder: Secondary | ICD-10-CM | POA: Diagnosis not present

## 2019-07-08 ENCOUNTER — Ambulatory Visit: Payer: PPO

## 2019-07-13 ENCOUNTER — Ambulatory Visit: Payer: PPO

## 2019-07-14 DIAGNOSIS — M19012 Primary osteoarthritis, left shoulder: Secondary | ICD-10-CM | POA: Diagnosis not present

## 2019-07-14 DIAGNOSIS — M75122 Complete rotator cuff tear or rupture of left shoulder, not specified as traumatic: Secondary | ICD-10-CM | POA: Diagnosis not present

## 2019-07-14 DIAGNOSIS — M25512 Pain in left shoulder: Secondary | ICD-10-CM | POA: Diagnosis not present

## 2019-07-17 DIAGNOSIS — M75102 Unspecified rotator cuff tear or rupture of left shoulder, not specified as traumatic: Secondary | ICD-10-CM

## 2019-07-17 HISTORY — DX: Unspecified rotator cuff tear or rupture of left shoulder, not specified as traumatic: M75.102

## 2019-07-29 ENCOUNTER — Ambulatory Visit: Payer: PPO

## 2019-07-29 DIAGNOSIS — Y999 Unspecified external cause status: Secondary | ICD-10-CM | POA: Diagnosis not present

## 2019-07-29 DIAGNOSIS — X58XXXA Exposure to other specified factors, initial encounter: Secondary | ICD-10-CM | POA: Diagnosis not present

## 2019-07-29 DIAGNOSIS — G8918 Other acute postprocedural pain: Secondary | ICD-10-CM | POA: Diagnosis not present

## 2019-07-29 DIAGNOSIS — S46012A Strain of muscle(s) and tendon(s) of the rotator cuff of left shoulder, initial encounter: Secondary | ICD-10-CM | POA: Diagnosis not present

## 2019-07-29 DIAGNOSIS — M19012 Primary osteoarthritis, left shoulder: Secondary | ICD-10-CM | POA: Diagnosis not present

## 2019-07-29 DIAGNOSIS — S43432A Superior glenoid labrum lesion of left shoulder, initial encounter: Secondary | ICD-10-CM | POA: Diagnosis not present

## 2019-07-29 DIAGNOSIS — M7542 Impingement syndrome of left shoulder: Secondary | ICD-10-CM | POA: Diagnosis not present

## 2019-07-30 ENCOUNTER — Encounter: Payer: Self-pay | Admitting: Family Medicine

## 2019-08-13 DIAGNOSIS — M25512 Pain in left shoulder: Secondary | ICD-10-CM | POA: Diagnosis not present

## 2019-08-13 DIAGNOSIS — Z4789 Encounter for other orthopedic aftercare: Secondary | ICD-10-CM | POA: Diagnosis not present

## 2019-08-19 DIAGNOSIS — M25512 Pain in left shoulder: Secondary | ICD-10-CM | POA: Diagnosis not present

## 2019-08-24 ENCOUNTER — Other Ambulatory Visit: Payer: Self-pay

## 2019-08-24 ENCOUNTER — Ambulatory Visit (INDEPENDENT_AMBULATORY_CARE_PROVIDER_SITE_OTHER): Payer: PPO | Admitting: Family Medicine

## 2019-08-24 ENCOUNTER — Encounter: Payer: Self-pay | Admitting: Family Medicine

## 2019-08-24 VITALS — BP 163/78 | HR 83 | Temp 97.9°F | Resp 16 | Ht 61.0 in | Wt 111.4 lb

## 2019-08-24 DIAGNOSIS — R197 Diarrhea, unspecified: Secondary | ICD-10-CM | POA: Diagnosis not present

## 2019-08-24 DIAGNOSIS — I10 Essential (primary) hypertension: Secondary | ICD-10-CM | POA: Diagnosis not present

## 2019-08-24 DIAGNOSIS — K5900 Constipation, unspecified: Secondary | ICD-10-CM

## 2019-08-24 DIAGNOSIS — E7800 Pure hypercholesterolemia, unspecified: Secondary | ICD-10-CM

## 2019-08-24 DIAGNOSIS — R030 Elevated blood-pressure reading, without diagnosis of hypertension: Secondary | ICD-10-CM | POA: Diagnosis not present

## 2019-08-24 LAB — CBC WITH DIFFERENTIAL/PLATELET
Basophils Absolute: 0.1 10*3/uL (ref 0.0–0.1)
Basophils Relative: 1 % (ref 0.0–3.0)
Eosinophils Absolute: 0.1 10*3/uL (ref 0.0–0.7)
Eosinophils Relative: 2.3 % (ref 0.0–5.0)
HCT: 39 % (ref 36.0–46.0)
Hemoglobin: 12.8 g/dL (ref 12.0–15.0)
Lymphocytes Relative: 24.6 % (ref 12.0–46.0)
Lymphs Abs: 1.3 10*3/uL (ref 0.7–4.0)
MCHC: 32.7 g/dL (ref 30.0–36.0)
MCV: 93.2 fl (ref 78.0–100.0)
Monocytes Absolute: 0.3 10*3/uL (ref 0.1–1.0)
Monocytes Relative: 6 % (ref 3.0–12.0)
Neutro Abs: 3.5 10*3/uL (ref 1.4–7.7)
Neutrophils Relative %: 66.1 % (ref 43.0–77.0)
Platelets: 283 10*3/uL (ref 150.0–400.0)
RBC: 4.19 Mil/uL (ref 3.87–5.11)
RDW: 13.2 % (ref 11.5–15.5)
WBC: 5.2 10*3/uL (ref 4.0–10.5)

## 2019-08-24 LAB — BASIC METABOLIC PANEL
BUN: 16 mg/dL (ref 6–23)
CO2: 28 mEq/L (ref 19–32)
Calcium: 9.8 mg/dL (ref 8.4–10.5)
Chloride: 104 mEq/L (ref 96–112)
Creatinine, Ser: 0.67 mg/dL (ref 0.40–1.20)
GFR: 84.71 mL/min (ref >60.00)
Glucose, Bld: 83 mg/dL (ref 70–99)
Potassium: 4.2 mEq/L (ref 3.5–5.1)
Sodium: 143 mEq/L (ref 135–145)

## 2019-08-24 LAB — LIPID PANEL
Cholesterol: 249 mg/dL — ABNORMAL HIGH (ref 0–200)
HDL: 66.4 mg/dL (ref >39.00)
LDL Cholesterol: 159 mg/dL — ABNORMAL HIGH (ref 0–99)
NonHDL: 182.41
Total CHOL/HDL Ratio: 4
Triglycerides: 115 mg/dL (ref 0.0–149.0)
VLDL: 23 mg/dL (ref 0.0–40.0)

## 2019-08-24 NOTE — Patient Instructions (Signed)
OK to stop probiotic.
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
## 2019-08-25 ENCOUNTER — Encounter: Payer: Self-pay | Admitting: Family Medicine

## 2019-08-25 DIAGNOSIS — M25512 Pain in left shoulder: Secondary | ICD-10-CM | POA: Diagnosis not present

## 2019-08-26 MED ORDER — ATORVASTATIN CALCIUM 20 MG PO TABS: 20.0000 mg | ORAL_TABLET | Freq: Every day | ORAL | 2 refills | Status: DC

## 2019-08-26 NOTE — Addendum Note (Signed)
Addended by: Marlene Bast A on: 08/26/2019 09:58 AM   Modules accepted: Orders

## 2019-08-31 DIAGNOSIS — M25512 Pain in left shoulder: Secondary | ICD-10-CM | POA: Diagnosis not present

## 2019-09-02 DIAGNOSIS — M25512 Pain in left shoulder: Secondary | ICD-10-CM | POA: Diagnosis not present

## 2019-09-07 DIAGNOSIS — M25512 Pain in left shoulder: Secondary | ICD-10-CM | POA: Diagnosis not present

## 2019-09-09 DIAGNOSIS — H9202 Otalgia, left ear: Secondary | ICD-10-CM | POA: Insufficient documentation

## 2019-09-09 DIAGNOSIS — M2669 Other specified disorders of temporomandibular joint: Secondary | ICD-10-CM | POA: Diagnosis not present

## 2019-09-14 DIAGNOSIS — M25512 Pain in left shoulder: Secondary | ICD-10-CM | POA: Diagnosis not present

## 2019-09-15 ENCOUNTER — Telehealth: Payer: Self-pay | Admitting: Family Medicine

## 2019-09-15 ENCOUNTER — Encounter: Payer: Self-pay | Admitting: Family Medicine

## 2019-09-15 DIAGNOSIS — R202 Paresthesia of skin: Secondary | ICD-10-CM

## 2019-09-15 NOTE — Telephone Encounter (Signed)
OK, referral ordered 

## 2019-09-15 NOTE — Telephone Encounter (Signed)
Patient's daughter is requesting a referral to Dr. Wilhemina Bonito with [facility] ph: [phone] fax: [phone]. Patient is requesting referral due to numbness in hands, ear aches, jaw pain. Referral needs to state she needs a live [place] interpreter. Neurologist will need recent MRI. Last MRI was 2007.

## 2019-09-15 NOTE — Telephone Encounter (Signed)
Pt's last OV regarding numbness was 12/30/18 and 01/15/19 but occurred in arms. She had an MR cervical spine w/o contrast done on 01/07/19 and pt saw Dr.Ramos 02/18/19.  Please advise, thanks.

## 2019-09-16 ENCOUNTER — Encounter: Payer: Self-pay | Admitting: Neurology

## 2019-09-16 DIAGNOSIS — G4485 Primary stabbing headache: Secondary | ICD-10-CM

## 2019-09-16 HISTORY — DX: Primary stabbing headache: G44.85

## 2019-09-16 NOTE — Telephone Encounter (Signed)
Patient's daughter advised.  

## 2019-09-17 NOTE — Telephone Encounter (Signed)
Insurance is not in [employer] for [employer]. Patient's daughter advised. Patient's daughter asked that referral to be sent to [facility] as that they are in-network. Appointment has been scheduled 10/11/19 with Dr. Posey Pronto.

## 2019-09-22 DIAGNOSIS — M25512 Pain in left shoulder: Secondary | ICD-10-CM | POA: Diagnosis not present

## 2019-09-24 DIAGNOSIS — M25512 Pain in left shoulder: Secondary | ICD-10-CM | POA: Diagnosis not present

## 2019-09-29 DIAGNOSIS — M25512 Pain in left shoulder: Secondary | ICD-10-CM | POA: Diagnosis not present

## 2019-10-01 DIAGNOSIS — M25512 Pain in left shoulder: Secondary | ICD-10-CM | POA: Diagnosis not present

## 2019-10-05 ENCOUNTER — Ambulatory Visit (INDEPENDENT_AMBULATORY_CARE_PROVIDER_SITE_OTHER): Payer: PPO

## 2019-10-05 ENCOUNTER — Other Ambulatory Visit: Payer: Self-pay

## 2019-10-05 DIAGNOSIS — Z23 Encounter for immunization: Secondary | ICD-10-CM | POA: Diagnosis not present

## 2019-10-05 DIAGNOSIS — M25512 Pain in left shoulder: Secondary | ICD-10-CM | POA: Diagnosis not present

## 2019-10-07 ENCOUNTER — Encounter: Payer: Self-pay | Admitting: Family Medicine

## 2019-10-07 DIAGNOSIS — M25512 Pain in left shoulder: Secondary | ICD-10-CM | POA: Diagnosis not present

## 2019-10-11 ENCOUNTER — Other Ambulatory Visit: Payer: Self-pay

## 2019-10-11 ENCOUNTER — Encounter: Payer: Self-pay | Admitting: Neurology

## 2019-10-11 ENCOUNTER — Ambulatory Visit (INDEPENDENT_AMBULATORY_CARE_PROVIDER_SITE_OTHER): Payer: PPO | Admitting: Neurology

## 2019-10-11 ENCOUNTER — Encounter

## 2019-10-11 DIAGNOSIS — G4485 Primary stabbing headache: Secondary | ICD-10-CM

## 2019-10-11 DIAGNOSIS — R519 Headache, unspecified: Secondary | ICD-10-CM | POA: Diagnosis not present

## 2019-10-11 NOTE — Patient Instructions (Signed)
MRI brain without contrast  OK to use tylenol and ibuprofen for head pain, but limit it to twice per week  Try neck stretching exercises  We will call you with the results
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
## 2019-10-12 DIAGNOSIS — M25512 Pain in left shoulder: Secondary | ICD-10-CM | POA: Diagnosis not present

## 2019-10-14 DIAGNOSIS — M25512 Pain in left shoulder: Secondary | ICD-10-CM | POA: Diagnosis not present

## 2019-10-28 ENCOUNTER — Ambulatory Visit
Admission: RE | Admit: 2019-10-28 | Discharge: 2019-10-28 | Disposition: A | Discharge status: Home / Self Care | Payer: PPO | Source: Ambulatory Visit | Attending: Neurology | Admitting: Neurology

## 2019-10-28 ENCOUNTER — Other Ambulatory Visit: Payer: Self-pay

## 2019-10-28 DIAGNOSIS — R519 Headache, unspecified: Secondary | ICD-10-CM | POA: Diagnosis not present

## 2019-10-28 DIAGNOSIS — G4485 Primary stabbing headache: Secondary | ICD-10-CM

## 2019-10-29 ENCOUNTER — Telehealth: Payer: Self-pay

## 2019-10-29 NOTE — Telephone Encounter (Signed)
Patient notified of mri results and verbally understood results

## 2019-11-10 ENCOUNTER — Other Ambulatory Visit: Payer: Self-pay | Admitting: Family Medicine

## 2019-11-10 DIAGNOSIS — E7800 Pure hypercholesterolemia, unspecified: Secondary | ICD-10-CM

## 2019-11-20 ENCOUNTER — Other Ambulatory Visit: Payer: Self-pay | Admitting: Family Medicine

## 2019-11-20 DIAGNOSIS — E7800 Pure hypercholesterolemia, unspecified: Secondary | ICD-10-CM

## 2019-12-17 DIAGNOSIS — I10 Essential (primary) hypertension: Secondary | ICD-10-CM

## 2019-12-17 HISTORY — DX: Essential (primary) hypertension: I10

## 2020-01-06 ENCOUNTER — Other Ambulatory Visit: Payer: Self-pay | Admitting: Family Medicine

## 2020-01-06 DIAGNOSIS — E7800 Pure hypercholesterolemia, unspecified: Secondary | ICD-10-CM

## 2020-02-03 ENCOUNTER — Other Ambulatory Visit: Payer: Self-pay | Admitting: Family Medicine

## 2020-02-03 DIAGNOSIS — E7800 Pure hypercholesterolemia, unspecified: Secondary | ICD-10-CM

## 2020-02-14 ENCOUNTER — Other Ambulatory Visit: Payer: Self-pay

## 2020-02-14 ENCOUNTER — Ambulatory Visit
Admission: RE | Admit: 2020-02-14 | Discharge: 2020-02-14 | Disposition: A | Discharge status: Home / Self Care | Payer: PPO | Source: Ambulatory Visit | Attending: Family Medicine | Admitting: Family Medicine

## 2020-02-14 DIAGNOSIS — Z1231 Encounter for screening mammogram for malignant neoplasm of breast: Secondary | ICD-10-CM | POA: Diagnosis not present

## 2020-02-26 ENCOUNTER — Other Ambulatory Visit: Payer: Self-pay | Admitting: Family Medicine

## 2020-02-26 DIAGNOSIS — E7800 Pure hypercholesterolemia, unspecified: Secondary | ICD-10-CM

## 2020-04-03 ENCOUNTER — Other Ambulatory Visit: Payer: Self-pay | Admitting: Family Medicine

## 2020-04-03 DIAGNOSIS — E7800 Pure hypercholesterolemia, unspecified: Secondary | ICD-10-CM

## 2020-04-12 ENCOUNTER — Ambulatory Visit (INDEPENDENT_AMBULATORY_CARE_PROVIDER_SITE_OTHER): Payer: PPO | Admitting: Family Medicine

## 2020-04-12 ENCOUNTER — Encounter: Payer: Self-pay | Admitting: Family Medicine

## 2020-04-12 ENCOUNTER — Other Ambulatory Visit: Payer: Self-pay

## 2020-04-12 VITALS — BP 167/81 | HR 71 | Temp 97.6°F | Resp 16 | Ht 61.0 in | Wt 113.0 lb

## 2020-04-12 DIAGNOSIS — E2839 Other primary ovarian failure: Secondary | ICD-10-CM | POA: Diagnosis not present

## 2020-04-12 DIAGNOSIS — Z8739 Personal history of other diseases of the musculoskeletal system and connective tissue: Secondary | ICD-10-CM | POA: Diagnosis not present

## 2020-04-12 DIAGNOSIS — Z23 Encounter for immunization: Secondary | ICD-10-CM | POA: Diagnosis not present

## 2020-04-12 DIAGNOSIS — Z8679 Personal history of other diseases of the circulatory system: Secondary | ICD-10-CM | POA: Diagnosis not present

## 2020-04-12 DIAGNOSIS — Z0000 Encounter for general adult medical examination without abnormal findings: Secondary | ICD-10-CM

## 2020-04-12 DIAGNOSIS — E7800 Pure hypercholesterolemia, unspecified: Secondary | ICD-10-CM | POA: Diagnosis not present

## 2020-04-12 MED ORDER — ATORVASTATIN CALCIUM 20 MG PO TABS: 20.0000 mg | ORAL_TABLET | Freq: Every day | ORAL | 3 refills | Status: DC
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
## 2020-04-12 NOTE — Addendum Note (Signed)
Addended by: Deveron Furlong D on: 04/12/2020 02:20 PM   Modules accepted: Orders

## 2020-04-12 NOTE — Patient Instructions (Signed)
Health Maintenance, Female Adopting a healthy lifestyle and getting preventive care are important in promoting health and wellness. Ask your health care provider about:  The right schedule for you to have regular tests and exams.  Things you can do on your own to prevent diseases and keep yourself healthy. What should I know about diet, weight, and exercise? Eat a healthy diet   Eat a diet that includes plenty of vegetables, fruits, low-fat dairy products, and lean protein.  Do not eat a lot of foods that are high in solid fats, added sugars, or sodium. Maintain a healthy weight Body mass index (BMI) is used to identify weight problems. It estimates body fat based on height and weight. Your health care provider can help determine your BMI and help you achieve or maintain a healthy weight. Get regular exercise Get regular exercise. This is one of the most important things you can do for your health. Most adults should:  Exercise for at least 150 minutes each week. The exercise should increase your heart rate and make you sweat (moderate-intensity exercise).  Do strengthening exercises at least twice a week. This is in addition to the moderate-intensity exercise.  Spend less time sitting. Even light physical activity can be beneficial. Watch cholesterol and blood lipids Have your blood tested for lipids and cholesterol at 81 years of age, then have this test every 5 years. Have your cholesterol levels checked more often if:  Your lipid or cholesterol levels are high.  You are older than 81 years of age.  You are at high risk for heart disease. What should I know about cancer screening? Depending on your health history and family history, you may need to have cancer screening at various ages. This may include screening for:  Breast cancer.  Cervical cancer.  Colorectal cancer.  Skin cancer.  Lung cancer. What should I know about heart disease, diabetes, and high blood  pressure? Blood pressure and heart disease  High blood pressure causes heart disease and increases the risk of stroke. This is more likely to develop in people who have high blood pressure readings, are of African descent, or are overweight.  Have your blood pressure checked: ? Every 3-5 years if you are 18-39 years of age. ? Every year if you are 40 years old or older. Diabetes Have regular diabetes screenings. This checks your fasting blood sugar level. Have the screening done:  Once every three years after age 40 if you are at a normal weight and have a low risk for diabetes.  More often and at a younger age if you are overweight or have a high risk for diabetes. What should I know about preventing infection? Hepatitis B If you have a higher risk for hepatitis B, you should be screened for this virus. Talk with your health care provider to find out if you are at risk for hepatitis B infection. Hepatitis C Testing is recommended for:  Everyone born from 1945 through 1965.  Anyone with known risk factors for hepatitis C. Sexually transmitted infections (STIs)  Get screened for STIs, including gonorrhea and chlamydia, if: ? You are sexually active and are younger than 81 years of age. ? You are older than 81 years of age and your health care provider tells you that you are at risk for this type of infection. ? Your sexual activity has changed since you were last screened, and you are at increased risk for chlamydia or gonorrhea. Ask your health care provider if   you are at risk.  Ask your health care provider about whether you are at high risk for HIV. Your health care provider may recommend a prescription medicine to help prevent HIV infection. If you choose to take medicine to prevent HIV, you should first get tested for HIV. You should then be tested every 3 months for as long as you are taking the medicine. Pregnancy  If you are about to stop having your period (premenopausal) and  you may become pregnant, seek counseling before you get pregnant.  Take 400 to 800 micrograms (mcg) of folic acid every day if you become pregnant.  Ask for birth control (contraception) if you want to prevent pregnancy. Osteoporosis and menopause Osteoporosis is a disease in which the bones lose minerals and strength with aging. This can result in bone fractures. If you are 65 years old or older, or if you are at risk for osteoporosis and fractures, ask your health care provider if you should:  Be screened for bone loss.  Take a calcium or vitamin D supplement to lower your risk of fractures.  Be given hormone replacement therapy (HRT) to treat symptoms of menopause. Follow these instructions at home: Lifestyle  Do not use any products that contain nicotine or tobacco, such as cigarettes, e-cigarettes, and chewing tobacco. If you need help quitting, ask your health care provider.  Do not use street drugs.  Do not share needles.  Ask your health care provider for help if you need support or information about quitting drugs. Alcohol use  Do not drink alcohol if: ? Your health care provider tells you not to drink. ? You are pregnant, may be pregnant, or are planning to become pregnant.  If you drink alcohol: ? Limit how much you use to 0-1 drink a day. ? Limit intake if you are breastfeeding.  Be aware of how much alcohol is in your drink. In the [place] one drink equals one 12 oz bottle of beer (355 mL), one 5 oz glass of wine (148 mL), or one 1 oz glass of hard liquor (44 mL). General instructions  Schedule regular health, dental, and eye exams.  Stay current with your vaccines.  Tell your health care provider if: ? You often feel depressed. ? You have ever been abused or do not feel safe at home. Summary  Adopting a healthy lifestyle and getting preventive care are important in promoting health and wellness.  Follow your health care provider's instructions about healthy  diet, exercising, and getting tested or screened for diseases.  Follow your health care provider's instructions on monitoring your cholesterol and blood pressure. This information is not intended to replace advice given to you by your health care provider. Make sure you discuss any questions you have with your health care provider. Document Revised: 11/25/2018 Document Reviewed: 11/25/2018 Elsevier Patient Education  2020 [employer]  

## 2020-04-13 ENCOUNTER — Telehealth: Payer: Self-pay | Admitting: Family Medicine

## 2020-04-13 LAB — COMPREHENSIVE METABOLIC PANEL
AG Ratio: 1.4 (calc) (ref 1.0–2.5)
ALT: 19 U/L (ref 6–29)
AST: 25 U/L (ref 10–35)
Albumin: 4 g/dL (ref 3.6–5.1)
Alkaline phosphatase (APISO): 104 U/L (ref 37–153)
BUN: 21 mg/dL (ref 7–25)
CO2: 23 mmol/L (ref 20–32)
Calcium: 9.4 mg/dL (ref 8.6–10.4)
Chloride: 107 mmol/L (ref 98–110)
Creat: 0.66 mg/dL (ref 0.60–0.88)
Globulin: 2.9 g/dL (calc) (ref 1.9–3.7)
Glucose, Bld: 80 mg/dL (ref 65–99)
Potassium: 4.3 mmol/L (ref 3.5–5.3)
Sodium: 142 mmol/L (ref 135–146)
Total Bilirubin: 0.6 mg/dL (ref 0.2–1.2)
Total Protein: 6.9 g/dL (ref 6.1–8.1)

## 2020-04-13 LAB — CBC WITH DIFFERENTIAL/PLATELET
Absolute Monocytes: 442 cells/uL (ref 200–950)
Basophils Absolute: 60 cells/uL (ref 0–200)
Basophils Relative: 0.9 %
Eosinophils Absolute: 127 cells/uL (ref 15–500)
Eosinophils Relative: 1.9 %
HCT: 39.1 % (ref 35.0–45.0)
Hemoglobin: 12.8 g/dL (ref 11.7–15.5)
Lymphs Abs: 1628 cells/uL (ref 850–3900)
MCH: 29.8 pg (ref 27.0–33.0)
MCHC: 32.7 g/dL (ref 32.0–36.0)
MCV: 91.1 fL (ref 80.0–100.0)
MPV: 10.8 fL (ref 7.5–12.5)
Monocytes Relative: 6.6 %
Neutro Abs: 4442 cells/uL (ref 1500–7800)
Neutrophils Relative %: 66.3 %
Platelets: 264 10*3/uL (ref 140–400)
RBC: 4.29 10*6/uL (ref 3.80–5.10)
RDW: 13.1 % (ref 11.0–15.0)
Total Lymphocyte: 24.3 %
WBC: 6.7 10*3/uL (ref 3.8–10.8)

## 2020-04-13 LAB — LIPID PANEL
Cholesterol: 147 mg/dL (ref <200)
HDL: 71 mg/dL (ref >=50)
LDL Cholesterol (Calc): 61 mg/dL (calc)
Non-HDL Cholesterol (Calc): 76 mg/dL (calc) (ref <130)
Total CHOL/HDL Ratio: 2.1 (calc) (ref <5.0)
Triglycerides: 66 mg/dL (ref <150)

## 2020-04-13 NOTE — Telephone Encounter (Signed)
Contacted patient and advised of results. Copy will be mailed.

## 2020-04-13 NOTE — Telephone Encounter (Signed)
Pt would like a copy of recent labs mailed to her.

## 2020-06-15 HISTORY — PX: OTHER SURGICAL HISTORY: SHX169

## 2020-07-03 ENCOUNTER — Ambulatory Visit
Admission: RE | Admit: 2020-07-03 | Discharge: 2020-07-03 | Disposition: A | Discharge status: Home / Self Care | Payer: PPO | Source: Ambulatory Visit | Attending: Family Medicine | Admitting: Family Medicine

## 2020-07-03 ENCOUNTER — Other Ambulatory Visit: Payer: Self-pay

## 2020-07-03 DIAGNOSIS — M810 Age-related osteoporosis without current pathological fracture: Secondary | ICD-10-CM | POA: Diagnosis not present

## 2020-07-03 DIAGNOSIS — E2839 Other primary ovarian failure: Secondary | ICD-10-CM

## 2020-07-03 DIAGNOSIS — Z8739 Personal history of other diseases of the musculoskeletal system and connective tissue: Secondary | ICD-10-CM

## 2020-07-03 DIAGNOSIS — M85852 Other specified disorders of bone density and structure, left thigh: Secondary | ICD-10-CM | POA: Diagnosis not present

## 2020-07-03 DIAGNOSIS — Z780 Asymptomatic menopausal state: Secondary | ICD-10-CM | POA: Diagnosis not present

## 2020-07-04 ENCOUNTER — Encounter: Payer: Self-pay | Admitting: Family Medicine

## 2020-09-06 DIAGNOSIS — R030 Elevated blood-pressure reading, without diagnosis of hypertension: Secondary | ICD-10-CM | POA: Diagnosis not present

## 2020-09-06 DIAGNOSIS — E785 Hyperlipidemia, unspecified: Secondary | ICD-10-CM | POA: Diagnosis not present

## 2020-09-06 DIAGNOSIS — R0789 Other chest pain: Secondary | ICD-10-CM | POA: Diagnosis not present

## 2020-09-06 DIAGNOSIS — R200 Anesthesia of skin: Secondary | ICD-10-CM | POA: Diagnosis not present

## 2020-09-07 DIAGNOSIS — R0789 Other chest pain: Secondary | ICD-10-CM | POA: Diagnosis not present

## 2020-09-26 DIAGNOSIS — R000 Tachycardia, unspecified: Secondary | ICD-10-CM | POA: Diagnosis not present

## 2020-09-26 DIAGNOSIS — R030 Elevated blood-pressure reading, without diagnosis of hypertension: Secondary | ICD-10-CM | POA: Diagnosis not present

## 2020-09-27 DIAGNOSIS — I080 Rheumatic disorders of both mitral and aortic valves: Secondary | ICD-10-CM | POA: Diagnosis not present

## 2020-10-02 ENCOUNTER — Telehealth: Payer: Self-pay

## 2020-10-02 DIAGNOSIS — B029 Zoster without complications: Secondary | ICD-10-CM | POA: Diagnosis not present

## 2020-10-02 NOTE — Telephone Encounter (Signed)
Severe ear pain. Requested an appt today with Dr. Anitra Lauth.  I offered her 4pm.  She declined.  She is requesting to see him only and before 4pm. I did suggest urgent care.  Please call [phone]

## 2020-10-02 NOTE — Telephone Encounter (Signed)
Daughter called back and was advised that we did not have work in availability today and to take mom to UC.

## 2020-10-04 DIAGNOSIS — B029 Zoster without complications: Secondary | ICD-10-CM | POA: Diagnosis not present

## 2020-10-13 ENCOUNTER — Ambulatory Visit (INDEPENDENT_AMBULATORY_CARE_PROVIDER_SITE_OTHER): Payer: PPO | Admitting: Family Medicine

## 2020-10-13 ENCOUNTER — Encounter: Payer: Self-pay | Admitting: Family Medicine

## 2020-10-13 ENCOUNTER — Other Ambulatory Visit: Payer: Self-pay

## 2020-10-13 VITALS — BP 153/77 | HR 98 | Temp 97.5°F | Resp 16 | Ht 61.0 in | Wt 106.4 lb

## 2020-10-13 DIAGNOSIS — B029 Zoster without complications: Secondary | ICD-10-CM | POA: Diagnosis not present

## 2020-10-13 DIAGNOSIS — M792 Neuralgia and neuritis, unspecified: Secondary | ICD-10-CM | POA: Diagnosis not present

## 2020-10-13 MED ORDER — GABAPENTIN 100 MG PO CAPS: ORAL_CAPSULE | ORAL | 1 refills | Status: DC

## 2020-10-13 NOTE — Patient Instructions (Signed)
Only use advil (2 tabs every 6-8 hours) IF you are having severe pain.  Start gabapentin 100 mg tabs: 1 at bedtime and in morning for 3d, then increase to 1 tab morning, mid-day, and bedtime for 3d.  Then increase to 2 tabs at bedtime and 1 tab morning and mid-day for 3d, then increase to 2 tabs three times per day.
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
## 2020-10-16 ENCOUNTER — Ambulatory Visit: Payer: PPO | Admitting: Family Medicine

## 2020-10-16 ENCOUNTER — Telehealth: Payer: Self-pay | Admitting: Family Medicine

## 2020-10-16 NOTE — Telephone Encounter (Signed)
LM for pt's daughter to return call.   

## 2020-10-16 NOTE — Telephone Encounter (Signed)
Spoke with patient's daughter, Gwen Pounds and advised recommendations given when she called earlier came directly from PCP. She stated they were already doing that and it was not working. In her own words, "Can I ever just speak with the doctor directly?" She requested to speak with PCP directly but advised due to patient clinic still occurring, she may receive a follow up call after he was done seeing patients.

## 2020-10-16 NOTE — Telephone Encounter (Signed)
Patient was last seen 10/13/20, advised to do the following: "Only use advil (2 tabs every 6-8 hours) IF you are having severe pain. Start gabapentin 100 mg tabs: 1 at bedtime and in morning for 3d, then increase to 1 tab morning, mid-day, and bedtime for 3d.  Then increase to 2 tabs at bedtime and 1 tab morning and mid-day for 3d, then increase to 2 tabs three times per day".   Please advise, thanks.

## 2020-10-16 NOTE — Telephone Encounter (Signed)
No ear drops can help, unfortunately. IT IS ok to continue taking ibup 400 mg 1-2 times per day as needed for the next 1 week. Continue to gradually increase the gabapentin and hopefully this will eventually help with the ear pain as well.-thx

## 2020-10-16 NOTE — Telephone Encounter (Signed)
Daughter calling to say Neurontin is not helping with her moms shingle pain. Is having severe pain in her left ear. Wants to know if there is an anti inflammatory ear drop she can use? Says Ibuprofen helps but she was directed to not use it everyday.

## 2020-10-16 NOTE — Telephone Encounter (Signed)
Daughter was given directions from Dr Anitra Lauth and says her mom is in severe pain. States a [facility] advised that steroid ear drops may help. Is very eager to talk with the [place].

## 2020-10-16 NOTE — Telephone Encounter (Signed)
Talked to patient, daughter was not there.  Advil is helping the ear pain very well but if no advil has lots of pain. Advised pt that it is ok to use advil 2 tab morning and nightime (bid) as needed for pain for now while we are titrating her gabapentin up. She expressed understanding and agreement with plan. Signed:  Crissie Sickles, MD           10/16/2020

## 2020-10-27 ENCOUNTER — Ambulatory Visit: Payer: PPO | Admitting: Family Medicine

## 2020-11-03 ENCOUNTER — Other Ambulatory Visit: Payer: Self-pay

## 2020-11-03 ENCOUNTER — Encounter: Payer: Self-pay | Admitting: Family Medicine

## 2020-11-03 ENCOUNTER — Ambulatory Visit (INDEPENDENT_AMBULATORY_CARE_PROVIDER_SITE_OTHER): Payer: PPO | Admitting: Family Medicine

## 2020-11-03 VITALS — BP 160/76 | HR 75 | Temp 97.4°F | Resp 16 | Ht 61.0 in | Wt 110.8 lb

## 2020-11-03 DIAGNOSIS — Z23 Encounter for immunization: Secondary | ICD-10-CM

## 2020-11-03 DIAGNOSIS — B0229 Other postherpetic nervous system involvement: Secondary | ICD-10-CM

## 2020-11-03 DIAGNOSIS — I10 Essential (primary) hypertension: Secondary | ICD-10-CM | POA: Diagnosis not present

## 2020-11-03 MED ORDER — GABAPENTIN 100 MG PO CAPS: ORAL_CAPSULE | ORAL | 0 refills | Status: DC

## 2020-11-03 MED ORDER — PREDNISONE 10 MG PO TABS
ORAL_TABLET | ORAL | 0 refills | Status: DC
Start: 2020-11-03 — End: 2020-12-04

## 2020-11-03 NOTE — Patient Instructions (Signed)
Take gabapentin 100mg  caps as follows: 1 cap three times per day for 7 days, then stop completely.
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
## 2020-11-08 DIAGNOSIS — I351 Nonrheumatic aortic (valve) insufficiency: Secondary | ICD-10-CM | POA: Diagnosis not present

## 2020-11-08 DIAGNOSIS — R030 Elevated blood-pressure reading, without diagnosis of hypertension: Secondary | ICD-10-CM | POA: Diagnosis not present

## 2020-11-08 DIAGNOSIS — R200 Anesthesia of skin: Secondary | ICD-10-CM | POA: Diagnosis not present

## 2020-11-08 DIAGNOSIS — R0789 Other chest pain: Secondary | ICD-10-CM | POA: Diagnosis not present

## 2020-12-01 ENCOUNTER — Other Ambulatory Visit: Payer: Self-pay

## 2020-12-04 ENCOUNTER — Ambulatory Visit (INDEPENDENT_AMBULATORY_CARE_PROVIDER_SITE_OTHER): Payer: PPO | Admitting: Family Medicine

## 2020-12-04 ENCOUNTER — Other Ambulatory Visit: Payer: Self-pay

## 2020-12-04 ENCOUNTER — Encounter: Payer: Self-pay | Admitting: Family Medicine

## 2020-12-04 VITALS — BP 151/75 | HR 83 | Temp 97.4°F | Resp 16 | Ht 61.0 in | Wt 108.2 lb

## 2020-12-04 DIAGNOSIS — I10 Essential (primary) hypertension: Secondary | ICD-10-CM | POA: Diagnosis not present

## 2020-12-04 DIAGNOSIS — E7800 Pure hypercholesterolemia, unspecified: Secondary | ICD-10-CM

## 2020-12-04 DIAGNOSIS — B029 Zoster without complications: Secondary | ICD-10-CM | POA: Diagnosis not present

## 2020-12-04 LAB — LIPID PANEL
Cholesterol: 165 mg/dL (ref 0–200)
HDL: 88 mg/dL (ref >39.00)
LDL Cholesterol: 60 mg/dL (ref 0–99)
NonHDL: 76.9
Total CHOL/HDL Ratio: 2
Triglycerides: 83 mg/dL (ref 0.0–149.0)
VLDL: 16.6 mg/dL (ref 0.0–40.0)

## 2020-12-04 LAB — COMPREHENSIVE METABOLIC PANEL
ALT: 21 U/L (ref 0–35)
AST: 28 U/L (ref 0–37)
Albumin: 4.3 g/dL (ref 3.5–5.2)
Alkaline Phosphatase: 80 U/L (ref 39–117)
BUN: 19 mg/dL (ref 6–23)
CO2: 33 mEq/L — ABNORMAL HIGH (ref 19–32)
Calcium: 10 mg/dL (ref 8.4–10.5)
Chloride: 103 mEq/L (ref 96–112)
Creatinine, Ser: 0.65 mg/dL (ref 0.40–1.20)
GFR: 82.7 mL/min (ref >60.00)
Glucose, Bld: 78 mg/dL (ref 70–99)
Potassium: 4.1 mEq/L (ref 3.5–5.1)
Sodium: 142 mEq/L (ref 135–145)
Total Bilirubin: 0.7 mg/dL (ref 0.2–1.2)
Total Protein: 7.3 g/dL (ref 6.0–8.3)
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
## 2021-02-01 ENCOUNTER — Other Ambulatory Visit: Payer: Self-pay | Admitting: Family Medicine

## 2021-02-01 DIAGNOSIS — E7800 Pure hypercholesterolemia, unspecified: Secondary | ICD-10-CM

## 2021-02-09 ENCOUNTER — Other Ambulatory Visit: Payer: Self-pay

## 2021-02-12 ENCOUNTER — Encounter: Payer: Self-pay | Admitting: Family Medicine

## 2021-02-12 ENCOUNTER — Ambulatory Visit (INDEPENDENT_AMBULATORY_CARE_PROVIDER_SITE_OTHER): Payer: PPO | Admitting: Family Medicine

## 2021-02-12 ENCOUNTER — Other Ambulatory Visit: Payer: Self-pay

## 2021-02-12 ENCOUNTER — Other Ambulatory Visit: Payer: Self-pay | Admitting: Family Medicine

## 2021-02-12 VITALS — BP 155/83 | HR 86 | Temp 97.4°F | Resp 16 | Ht 61.0 in | Wt 108.6 lb

## 2021-02-12 DIAGNOSIS — I10 Essential (primary) hypertension: Secondary | ICD-10-CM | POA: Diagnosis not present

## 2021-02-12 DIAGNOSIS — E7800 Pure hypercholesterolemia, unspecified: Secondary | ICD-10-CM

## 2021-02-12 DIAGNOSIS — M810 Age-related osteoporosis without current pathological fracture: Secondary | ICD-10-CM

## 2021-02-12 DIAGNOSIS — Z8619 Personal history of other infectious and parasitic diseases: Secondary | ICD-10-CM | POA: Diagnosis not present

## 2021-02-12 MED ORDER — HYDROCHLOROTHIAZIDE 25 MG PO TABS: 25.0000 mg | ORAL_TABLET | Freq: Every day | ORAL | 3 refills | Status: DC

## 2021-02-12 MED ORDER — ATORVASTATIN CALCIUM 20 MG PO TABS
20.0000 mg | ORAL_TABLET | Freq: Every day | ORAL | 3 refills | Status: DC
Start: 2021-02-12 — End: 2022-05-01
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
## 2021-04-12 ENCOUNTER — Other Ambulatory Visit: Payer: Self-pay | Admitting: Family Medicine

## 2021-04-12 DIAGNOSIS — Z1231 Encounter for screening mammogram for malignant neoplasm of breast: Secondary | ICD-10-CM

## 2021-05-24 DIAGNOSIS — H16223 Keratoconjunctivitis sicca, not specified as Sjogren's, bilateral: Secondary | ICD-10-CM | POA: Diagnosis not present

## 2021-05-24 DIAGNOSIS — D3132 Benign neoplasm of left choroid: Secondary | ICD-10-CM | POA: Diagnosis not present

## 2021-06-01 ENCOUNTER — Ambulatory Visit
Admission: RE | Admit: 2021-06-01 | Discharge: 2021-06-01 | Disposition: A | Discharge status: Home / Self Care | Payer: PPO | Source: Ambulatory Visit | Attending: Family Medicine | Admitting: Family Medicine

## 2021-06-01 ENCOUNTER — Other Ambulatory Visit: Payer: Self-pay

## 2021-06-01 DIAGNOSIS — Z1231 Encounter for screening mammogram for malignant neoplasm of breast: Secondary | ICD-10-CM | POA: Diagnosis not present

## 2021-06-05 ENCOUNTER — Ambulatory Visit (INDEPENDENT_AMBULATORY_CARE_PROVIDER_SITE_OTHER): Payer: PPO

## 2021-06-05 ENCOUNTER — Other Ambulatory Visit: Payer: Self-pay

## 2021-06-05 DIAGNOSIS — E7800 Pure hypercholesterolemia, unspecified: Secondary | ICD-10-CM | POA: Diagnosis not present

## 2021-06-05 DIAGNOSIS — I10 Essential (primary) hypertension: Secondary | ICD-10-CM

## 2021-06-05 LAB — CBC WITH DIFFERENTIAL/PLATELET
Basophils Absolute: 0 10*3/uL (ref 0.0–0.1)
Basophils Relative: 0.8 % (ref 0.0–3.0)
Eosinophils Absolute: 0.1 10*3/uL (ref 0.0–0.7)
Eosinophils Relative: 2.6 % (ref 0.0–5.0)
HCT: 39.2 % (ref 36.0–46.0)
Hemoglobin: 13 g/dL (ref 12.0–15.0)
Lymphocytes Relative: 20.6 % (ref 12.0–46.0)
Lymphs Abs: 1.1 10*3/uL (ref 0.7–4.0)
MCHC: 33.1 g/dL (ref 30.0–36.0)
MCV: 94 fl (ref 78.0–100.0)
Monocytes Absolute: 0.3 10*3/uL (ref 0.1–1.0)
Monocytes Relative: 6.4 % (ref 3.0–12.0)
Neutro Abs: 3.8 10*3/uL (ref 1.4–7.7)
Neutrophils Relative %: 69.6 % (ref 43.0–77.0)
Platelets: 279 10*3/uL (ref 150.0–400.0)
RBC: 4.17 Mil/uL (ref 3.87–5.11)
RDW: 13.9 % (ref 11.5–15.5)
WBC: 5.4 10*3/uL (ref 4.0–10.5)

## 2021-06-05 LAB — LIPID PANEL
Cholesterol: 156 mg/dL (ref 0–200)
HDL: 77.4 mg/dL (ref >39.00)
LDL Cholesterol: 63 mg/dL (ref 0–99)
NonHDL: 78.19
Total CHOL/HDL Ratio: 2
Triglycerides: 76 mg/dL (ref 0.0–149.0)
VLDL: 15.2 mg/dL (ref 0.0–40.0)

## 2021-06-05 LAB — COMPREHENSIVE METABOLIC PANEL
ALT: 15 U/L (ref 0–35)
AST: 20 U/L (ref 0–37)
Albumin: 4 g/dL (ref 3.5–5.2)
Alkaline Phosphatase: 71 U/L (ref 39–117)
BUN: 19 mg/dL (ref 6–23)
CO2: 30 mEq/L (ref 19–32)
Calcium: 9.7 mg/dL (ref 8.4–10.5)
Chloride: 102 mEq/L (ref 96–112)
Creatinine, Ser: 0.7 mg/dL (ref 0.40–1.20)
GFR: 80.95 mL/min (ref >60.00)
Glucose, Bld: 84 mg/dL (ref 70–99)
Potassium: 4.3 mEq/L (ref 3.5–5.1)
Sodium: 141 mEq/L (ref 135–145)
Total Bilirubin: 0.5 mg/dL (ref 0.2–1.2)
Total Protein: 6.8 g/dL (ref 6.0–8.3)

## 2021-06-12 ENCOUNTER — Encounter: Payer: Self-pay | Admitting: Family Medicine

## 2021-06-12 ENCOUNTER — Ambulatory Visit (INDEPENDENT_AMBULATORY_CARE_PROVIDER_SITE_OTHER): Payer: PPO | Admitting: Family Medicine

## 2021-06-12 ENCOUNTER — Other Ambulatory Visit: Payer: Self-pay

## 2021-06-12 VITALS — BP 147/71 | HR 84 | Temp 97.7°F | Resp 16 | Ht 61.0 in | Wt 103.4 lb

## 2021-06-12 DIAGNOSIS — I10 Essential (primary) hypertension: Secondary | ICD-10-CM | POA: Diagnosis not present

## 2021-06-12 DIAGNOSIS — Z23 Encounter for immunization: Secondary | ICD-10-CM

## 2021-06-12 DIAGNOSIS — Z0000 Encounter for general adult medical examination without abnormal findings: Secondary | ICD-10-CM | POA: Diagnosis not present

## 2021-06-12 DIAGNOSIS — E7800 Pure hypercholesterolemia, unspecified: Secondary | ICD-10-CM | POA: Diagnosis not present

## 2021-06-12 NOTE — Addendum Note (Signed)
Addended by: Deveron Furlong D on: 06/12/2021 11:00 AM   Modules accepted: Orders
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
## 2021-07-04 ENCOUNTER — Telehealth: Payer: Self-pay | Admitting: Family Medicine

## 2021-07-04 NOTE — Telephone Encounter (Signed)
Spoke with patient she declined AWV  

## 2022-02-20 ENCOUNTER — Telehealth: Payer: Self-pay

## 2022-02-20 NOTE — Telephone Encounter (Signed)
Called pt to schedule AWV. Please schedule with health coach, Toria or Raymont Andreoni. ? ?

## 2022-03-05 ENCOUNTER — Ambulatory Visit (INDEPENDENT_AMBULATORY_CARE_PROVIDER_SITE_OTHER): Payer: PPO | Admitting: Family Medicine

## 2022-03-05 ENCOUNTER — Other Ambulatory Visit: Payer: Self-pay

## 2022-03-05 ENCOUNTER — Encounter: Payer: Self-pay | Admitting: Family Medicine

## 2022-03-05 ENCOUNTER — Ambulatory Visit: Payer: PPO | Admitting: Family Medicine

## 2022-03-05 VITALS — BP 136/78 | HR 100 | Temp 97.9°F | Ht 61.0 in | Wt 106.2 lb

## 2022-03-05 DIAGNOSIS — J069 Acute upper respiratory infection, unspecified: Secondary | ICD-10-CM | POA: Diagnosis not present

## 2022-03-05 DIAGNOSIS — I10 Essential (primary) hypertension: Secondary | ICD-10-CM | POA: Diagnosis not present

## 2022-03-05 DIAGNOSIS — H1031 Unspecified acute conjunctivitis, right eye: Secondary | ICD-10-CM

## 2022-03-05 DIAGNOSIS — E7800 Pure hypercholesterolemia, unspecified: Secondary | ICD-10-CM | POA: Diagnosis not present

## 2022-03-05 DIAGNOSIS — J209 Acute bronchitis, unspecified: Secondary | ICD-10-CM | POA: Diagnosis not present

## 2022-03-05 MED ORDER — PREDNISONE 20 MG PO TABS: ORAL_TABLET | ORAL | 0 refills | Status: DC

## 2022-03-05 MED ORDER — POLYMYXIN B-TRIMETHOPRIM 10000-0.1 UNIT/ML-% OP SOLN: 2.0000 [drp] | Freq: Four times a day (QID) | OPHTHALMIC | 0 refills | Status: DC

## 2022-03-05 MED ORDER — AZITHROMYCIN 250 MG PO TABS: ORAL_TABLET | ORAL | 0 refills | Status: DC
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
## 2022-03-07 ENCOUNTER — Other Ambulatory Visit: Payer: Self-pay

## 2022-03-07 ENCOUNTER — Ambulatory Visit: Payer: PPO

## 2022-03-07 DIAGNOSIS — Z91199 Patient's noncompliance with other medical treatment and regimen due to unspecified reason: Secondary | ICD-10-CM

## 2022-03-07 NOTE — Patient Instructions (Signed)
?  Gabriella Craig , ?Thank you for taking time to come for your Medicare Wellness Visit. I appreciate your ongoing commitment to your health goals. Please review the following plan we discussed and let me know if I can assist you in the future.  ? ?These are the goals we discussed: ? Goals   ?None ?  ?  ?This is a list of the screening recommended for you and due dates:  ?Health Maintenance  ?Topic Date Due  ? COVID-19 Vaccine (4 - Booster for Moderna series) 03/21/2022*  ? Tetanus Vaccine  12/16/2025  ? Pneumonia Vaccine  Completed  ? Flu Shot  Completed  ? DEXA scan (bone density measurement)  Completed  ? Zoster (Shingles) Vaccine  Completed  ? HPV Vaccine  Aged Out  ?*Topic was postponed. The date shown is not the original due date.  ?  ?
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
## 2022-03-08 NOTE — Progress Notes (Signed)
Pt No showed appt ?

## 2022-04-22 ENCOUNTER — Other Ambulatory Visit: Payer: Self-pay | Admitting: Family Medicine

## 2022-05-01 ENCOUNTER — Other Ambulatory Visit: Payer: Self-pay | Admitting: Family Medicine

## 2022-05-01 DIAGNOSIS — E7800 Pure hypercholesterolemia, unspecified: Secondary | ICD-10-CM

## 2022-05-06 ENCOUNTER — Other Ambulatory Visit: Payer: Self-pay

## 2022-05-06 MED ORDER — HYDROCHLOROTHIAZIDE 25 MG PO TABS: 25.0000 mg | ORAL_TABLET | Freq: Every day | ORAL | 0 refills | Status: DC

## 2022-05-28 ENCOUNTER — Other Ambulatory Visit: Payer: Self-pay | Admitting: Family Medicine

## 2022-05-28 DIAGNOSIS — E7800 Pure hypercholesterolemia, unspecified: Secondary | ICD-10-CM

## 2022-05-30 ENCOUNTER — Other Ambulatory Visit: Payer: Self-pay | Admitting: Family Medicine

## 2022-05-30 DIAGNOSIS — Z1231 Encounter for screening mammogram for malignant neoplasm of breast: Secondary | ICD-10-CM

## 2022-05-31 ENCOUNTER — Ambulatory Visit: Payer: PPO | Admitting: Family Medicine

## 2022-05-31 ENCOUNTER — Encounter: Payer: Self-pay | Admitting: Family Medicine

## 2022-05-31 ENCOUNTER — Ambulatory Visit (INDEPENDENT_AMBULATORY_CARE_PROVIDER_SITE_OTHER): Payer: PPO | Admitting: Family Medicine

## 2022-05-31 VITALS — BP 130/73 | HR 86 | Temp 97.8°F | Ht 61.0 in | Wt 108.4 lb

## 2022-05-31 DIAGNOSIS — W57XXXA Bitten or stung by nonvenomous insect and other nonvenomous arthropods, initial encounter: Secondary | ICD-10-CM

## 2022-05-31 DIAGNOSIS — S20461A Insect bite (nonvenomous) of right back wall of thorax, initial encounter: Secondary | ICD-10-CM | POA: Diagnosis not present

## 2022-05-31 DIAGNOSIS — R5383 Other fatigue: Secondary | ICD-10-CM

## 2022-05-31 MED ORDER — DOXYCYCLINE HYCLATE 100 MG PO CAPS: 100.0000 mg | ORAL_CAPSULE | Freq: Two times a day (BID) | ORAL | 0 refills | Status: AC
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
## 2022-06-07 ENCOUNTER — Ambulatory Visit
Admission: RE | Admit: 2022-06-07 | Discharge: 2022-06-07 | Disposition: A | Discharge status: Home / Self Care | Payer: PPO | Source: Ambulatory Visit | Attending: Family Medicine | Admitting: Family Medicine

## 2022-06-07 DIAGNOSIS — Z1231 Encounter for screening mammogram for malignant neoplasm of breast: Secondary | ICD-10-CM

## 2022-06-12 ENCOUNTER — Encounter: Payer: Self-pay | Admitting: Family Medicine

## 2022-06-12 ENCOUNTER — Ambulatory Visit (INDEPENDENT_AMBULATORY_CARE_PROVIDER_SITE_OTHER): Payer: PPO | Admitting: Family Medicine

## 2022-06-12 VITALS — BP 132/74 | HR 92 | Temp 98.0°F | Ht 61.5 in | Wt 106.6 lb

## 2022-06-12 DIAGNOSIS — I10 Essential (primary) hypertension: Secondary | ICD-10-CM

## 2022-06-12 DIAGNOSIS — Z0001 Encounter for general adult medical examination with abnormal findings: Secondary | ICD-10-CM | POA: Diagnosis not present

## 2022-06-12 DIAGNOSIS — E7800 Pure hypercholesterolemia, unspecified: Secondary | ICD-10-CM

## 2022-06-12 DIAGNOSIS — Z0000 Encounter for general adult medical examination without abnormal findings: Secondary | ICD-10-CM

## 2022-06-12 DIAGNOSIS — B070 Plantar wart: Secondary | ICD-10-CM | POA: Diagnosis not present

## 2022-06-12 LAB — COMPREHENSIVE METABOLIC PANEL
ALT: 19 U/L (ref 0–35)
AST: 25 U/L (ref 0–37)
Albumin: 4.2 g/dL (ref 3.5–5.2)
Alkaline Phosphatase: 97 U/L (ref 39–117)
BUN: 19 mg/dL (ref 6–23)
CO2: 32 mEq/L (ref 19–32)
Calcium: 9.8 mg/dL (ref 8.4–10.5)
Chloride: 104 mEq/L (ref 96–112)
Creatinine, Ser: 0.72 mg/dL (ref 0.40–1.20)
GFR: 77.7 mL/min (ref >60.00)
Glucose, Bld: 86 mg/dL (ref 70–99)
Potassium: 3.5 mEq/L (ref 3.5–5.1)
Sodium: 143 mEq/L (ref 135–145)
Total Bilirubin: 0.8 mg/dL (ref 0.2–1.2)
Total Protein: 7 g/dL (ref 6.0–8.3)

## 2022-06-12 LAB — CBC
HCT: 43.3 % (ref 36.0–46.0)
Hemoglobin: 14.1 g/dL (ref 12.0–15.0)
MCHC: 32.6 g/dL (ref 30.0–36.0)
MCV: 95.3 fl (ref 78.0–100.0)
Platelets: 233 10*3/uL (ref 150.0–400.0)
RBC: 4.55 Mil/uL (ref 3.87–5.11)
RDW: 14.1 % (ref 11.5–15.5)
WBC: 5.4 10*3/uL (ref 4.0–10.5)

## 2022-06-12 LAB — LIPID PANEL
Cholesterol: 160 mg/dL (ref 0–200)
HDL: 81.7 mg/dL (ref >39.00)
LDL Cholesterol: 58 mg/dL (ref 0–99)
NonHDL: 77.92
Total CHOL/HDL Ratio: 2
Triglycerides: 98 mg/dL (ref 0.0–149.0)
VLDL: 19.6 mg/dL (ref 0.0–40.0)

## 2022-06-12 MED ORDER — HYDROCHLOROTHIAZIDE 25 MG PO TABS: 25.0000 mg | ORAL_TABLET | Freq: Every day | ORAL | 1 refills | Status: DC

## 2022-06-12 MED ORDER — ATORVASTATIN CALCIUM 20 MG PO TABS: 20.0000 mg | ORAL_TABLET | Freq: Every day | ORAL | 1 refills | Status: DC
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
## 2022-06-12 NOTE — Patient Instructions (Signed)

## 2022-06-20 DIAGNOSIS — L237 Allergic contact dermatitis due to plants, except food: Secondary | ICD-10-CM | POA: Diagnosis not present

## 2022-07-03 ENCOUNTER — Ambulatory Visit (INDEPENDENT_AMBULATORY_CARE_PROVIDER_SITE_OTHER): Payer: PPO

## 2022-07-03 DIAGNOSIS — Z0000 Encounter for general adult medical examination without abnormal findings: Secondary | ICD-10-CM
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
## 2022-07-03 NOTE — Patient Instructions (Addendum)
Gabriella Craig , Thank you for taking time to come for your Medicare Wellness Visit. I appreciate your ongoing commitment to your health goals. Please review the following plan we discussed and let me know if I can assist you in the future.   Screening recommendations/referrals: Colonoscopy: no longer required  Mammogram: done 06/07/22 Bone Density: 07/03/20 repeat every 2 year  Recommended yearly ophthalmology/optometry visit for glaucoma screening and checkup Recommended yearly dental visit for hygiene and checkup  Vaccinations: Influenza vaccine: done 11/12/21 repeat every year  Pneumococcal vaccine: Up to date Tdap vaccine: done 12/17/15 repeat every 10 years  Shingles vaccine: Done 2/19, 04/07/21    Covid-19:completed 3/20, 4/20, 11/12/20  Advanced directives: Please bring a copy of your health care power of attorney and living will to the office at your convenience.  Conditions/risks identified: none at this time   Next appointment: Follow up in one year for your annual wellness visit    Preventive Care 65 Years and Older, Female Preventive care refers to lifestyle choices and visits with your health care provider that can promote health and wellness. What does preventive care include? A yearly physical exam. This is also called an annual well check. Dental exams once or twice a year. Routine eye exams. Ask your health care provider how often you should have your eyes checked. Personal lifestyle choices, including: Daily care of your teeth and gums. Regular physical activity. Eating a healthy diet. Avoiding tobacco and drug use. Limiting alcohol use. Practicing safe sex. Taking low-dose aspirin every day. Taking vitamin and mineral supplements as recommended by your health care provider. What happens during an annual well check? The services and screenings done by your health care provider during your annual well check will depend on your age, overall health, lifestyle risk  factors, and family history of disease. Counseling  Your health care provider may ask you questions about your: Alcohol use. Tobacco use. Drug use. Emotional well-being. Home and relationship well-being. Sexual activity. Eating habits. History of falls. Memory and ability to understand (cognition). Work and work Statistician. Reproductive health. Screening  You may have the following tests or measurements: Height, weight, and BMI. Blood pressure. Lipid and cholesterol levels. These may be checked every 5 years, or more frequently if you are over 67 years old. Skin check. Lung cancer screening. You may have this screening every year starting at age 31 if you have a 30-pack-year history of smoking and currently smoke or have quit within the past 15 years. Fecal occult blood test (FOBT) of the stool. You may have this test every year starting at age 31. Flexible sigmoidoscopy or colonoscopy. You may have a sigmoidoscopy every 5 years or a colonoscopy every 10 years starting at age 83. Hepatitis C blood test. Hepatitis B blood test. Sexually transmitted disease (STD) testing. Diabetes screening. This is done by checking your blood sugar (glucose) after you have not eaten for a while (fasting). You may have this done every 1-3 years. Bone density scan. This is done to screen for osteoporosis. You may have this done starting at age 32. Mammogram. This may be done every 1-2 years. Talk to your health care provider about how often you should have regular mammograms. Talk with your health care provider about your test results, treatment options, and if necessary, the need for more tests. Vaccines  Your health care provider may recommend certain vaccines, such as: Influenza vaccine. This is recommended every year. Tetanus, diphtheria, and acellular pertussis (Tdap, Td) vaccine. You may  need a Td booster every 10 years. Zoster vaccine. You may need this after age 56. Pneumococcal 13-valent  conjugate (PCV13) vaccine. One dose is recommended after age 74. Pneumococcal polysaccharide (PPSV23) vaccine. One dose is recommended after age 50. Talk to your health care provider about which screenings and vaccines you need and how often you need them. This information is not intended to replace advice given to you by your health care provider. Make sure you discuss any questions you have with your health care provider. Document Released: 12/29/2015 Document Revised: 08/21/2016 Document Reviewed: 10/03/2015 Elsevier Interactive Patient Education  2017 [employer] Prevention in the Home Falls can cause injuries. They can happen to people of all ages. There are many things you can do to make your home safe and to help prevent falls. What can I do on the outside of my home? Regularly fix the edges of walkways and driveways and fix any cracks. Remove anything that might make you trip as you walk through a door, such as a raised step or threshold. Trim any bushes or trees on the path to your home. Use bright outdoor lighting. Clear any walking paths of anything that might make someone trip, such as rocks or tools. Regularly check to see if handrails are loose or broken. Make sure that both sides of any steps have handrails. Any raised decks and porches should have guardrails on the edges. Have any leaves, snow, or ice cleared regularly. Use sand or salt on walking paths during winter. Clean up any spills in your garage right away. This includes oil or grease spills. What can I do in the bathroom? Use night lights. Install grab bars by the toilet and in the tub and shower. Do not use towel bars as grab bars. Use non-skid mats or decals in the tub or shower. If you need to sit down in the shower, use a plastic, non-slip stool. Keep the floor dry. Clean up any water that spills on the floor as soon as it happens. Remove soap buildup in the tub or shower regularly. Attach bath mats  securely with double-sided non-slip rug tape. Do not have throw rugs and other things on the floor that can make you trip. What can I do in the bedroom? Use night lights. Make sure that you have a light by your bed that is easy to reach. Do not use any sheets or blankets that are too big for your bed. They should not hang down onto the floor. Have a firm chair that has side arms. You can use this for support while you get dressed. Do not have throw rugs and other things on the floor that can make you trip. What can I do in the kitchen? Clean up any spills right away. Avoid walking on wet floors. Keep items that you use a lot in easy-to-reach places. If you need to reach something above you, use a strong step stool that has a grab bar. Keep electrical cords out of the way. Do not use floor polish or wax that makes floors slippery. If you must use wax, use non-skid floor wax. Do not have throw rugs and other things on the floor that can make you trip. What can I do with my stairs? Do not leave any items on the stairs. Make sure that there are handrails on both sides of the stairs and use them. Fix handrails that are broken or loose. Make sure that handrails are as long as the stairways.  Check any carpeting to make sure that it is firmly attached to the stairs. Fix any carpet that is loose or worn. Avoid having throw rugs at the top or bottom of the stairs. If you do have throw rugs, attach them to the floor with carpet tape. Make sure that you have a light switch at the top of the stairs and the bottom of the stairs. If you do not have them, ask someone to add them for you. What else can I do to help prevent falls? Wear shoes that: Do not have high heels. Have rubber bottoms. Are comfortable and fit you well. Are closed at the toe. Do not wear sandals. If you use a stepladder: Make sure that it is fully opened. Do not climb a closed stepladder. Make sure that both sides of the stepladder  are locked into place. Ask someone to hold it for you, if possible. Clearly mark and make sure that you can see: Any grab bars or handrails. First and last steps. Where the edge of each step is. Use tools that help you move around (mobility aids) if they are needed. These include: Canes. Walkers. Scooters. Crutches. Turn on the lights when you go into a dark area. Replace any light bulbs as soon as they burn out. Set up your furniture so you have a clear path. Avoid moving your furniture around. If any of your floors are uneven, fix them. If there are any pets around you, be aware of where they are. Review your medicines with your doctor. Some medicines can make you feel dizzy. This can increase your chance of falling. Ask your doctor what other things that you can do to help prevent falls. This information is not intended to replace advice given to you by your health care provider. Make sure you discuss any questions you have with your health care provider. Document Released: 09/28/2009 Document Revised: 05/09/2016 Document Reviewed: 01/06/2015 Elsevier Interactive Patient Education  2017 [employer].

## 2022-07-17 DIAGNOSIS — H8111 Benign paroxysmal vertigo, right ear: Secondary | ICD-10-CM | POA: Diagnosis not present

## 2022-07-31 DIAGNOSIS — H16223 Keratoconjunctivitis sicca, not specified as Sjogren's, bilateral: Secondary | ICD-10-CM | POA: Diagnosis not present

## 2022-08-20 NOTE — Addendum Note (Signed)
Addended by: Edgar Frisk on: 08/20/2022 11:49 AM   Modules accepted: Level of Service

## 2022-09-02 DIAGNOSIS — H16223 Keratoconjunctivitis sicca, not specified as Sjogren's, bilateral: Secondary | ICD-10-CM | POA: Diagnosis not present

## 2022-09-20 IMAGING — MG DIGITAL SCREENING BILAT W/ CAD
4 series · 4 of 4 positions shown · non-contrast
Comparison: Previous exam(s).

CLINICAL DATA: Screening.

EXAM:
DIGITAL SCREENING BILATERAL MAMMOGRAM WITH CAD
TECHNIQUE: Bilateral screening digital craniocaudal and mediolateral oblique
mammograms were obtained. The images were evaluated with
computer-aided detection.

[L CC]
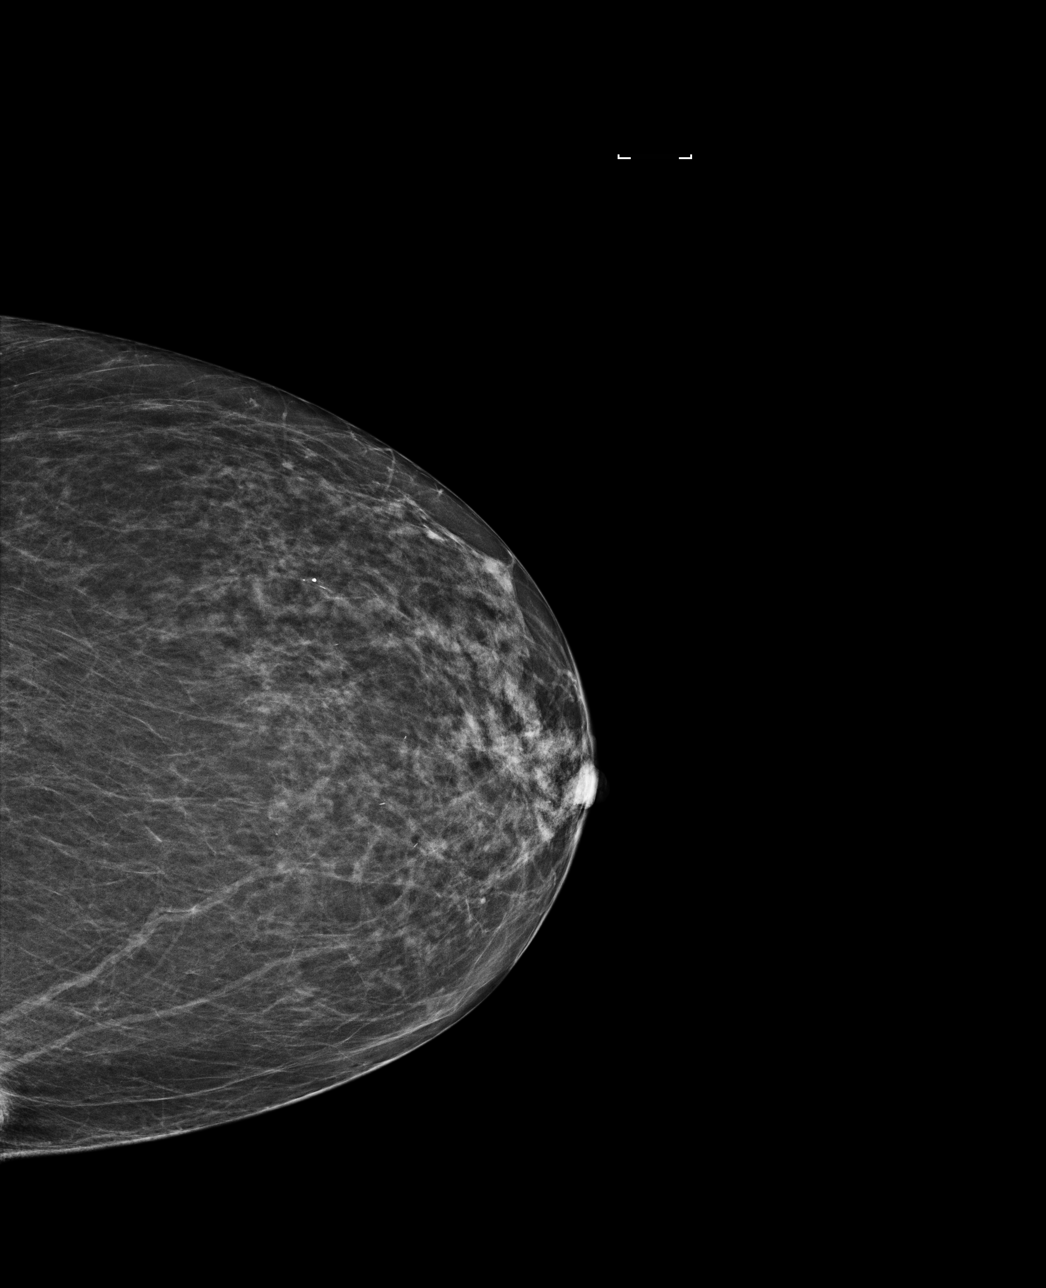

[R CC]
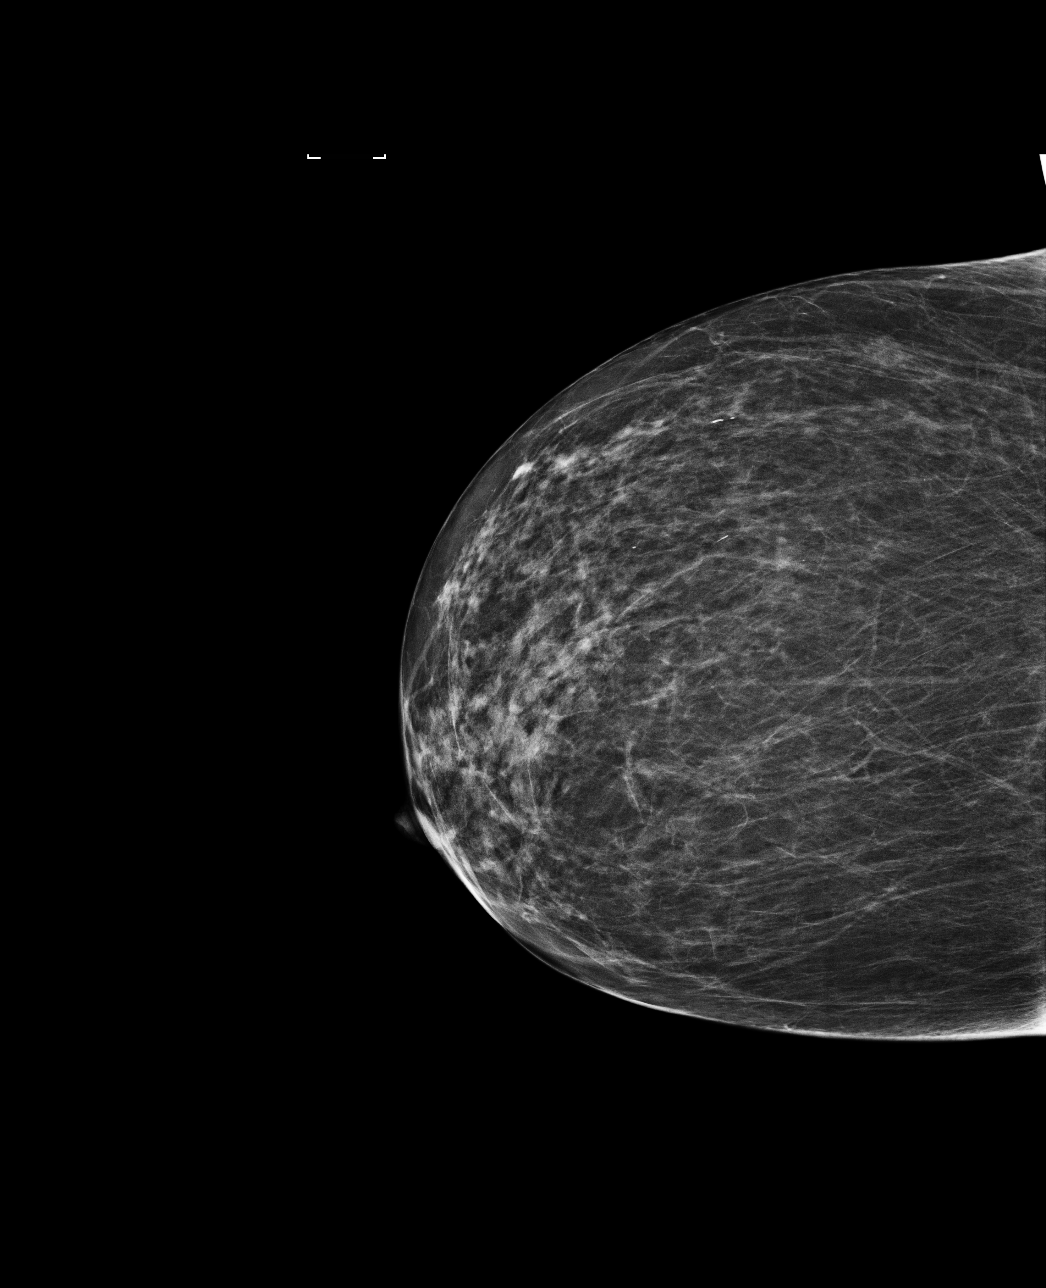

[L MLO]
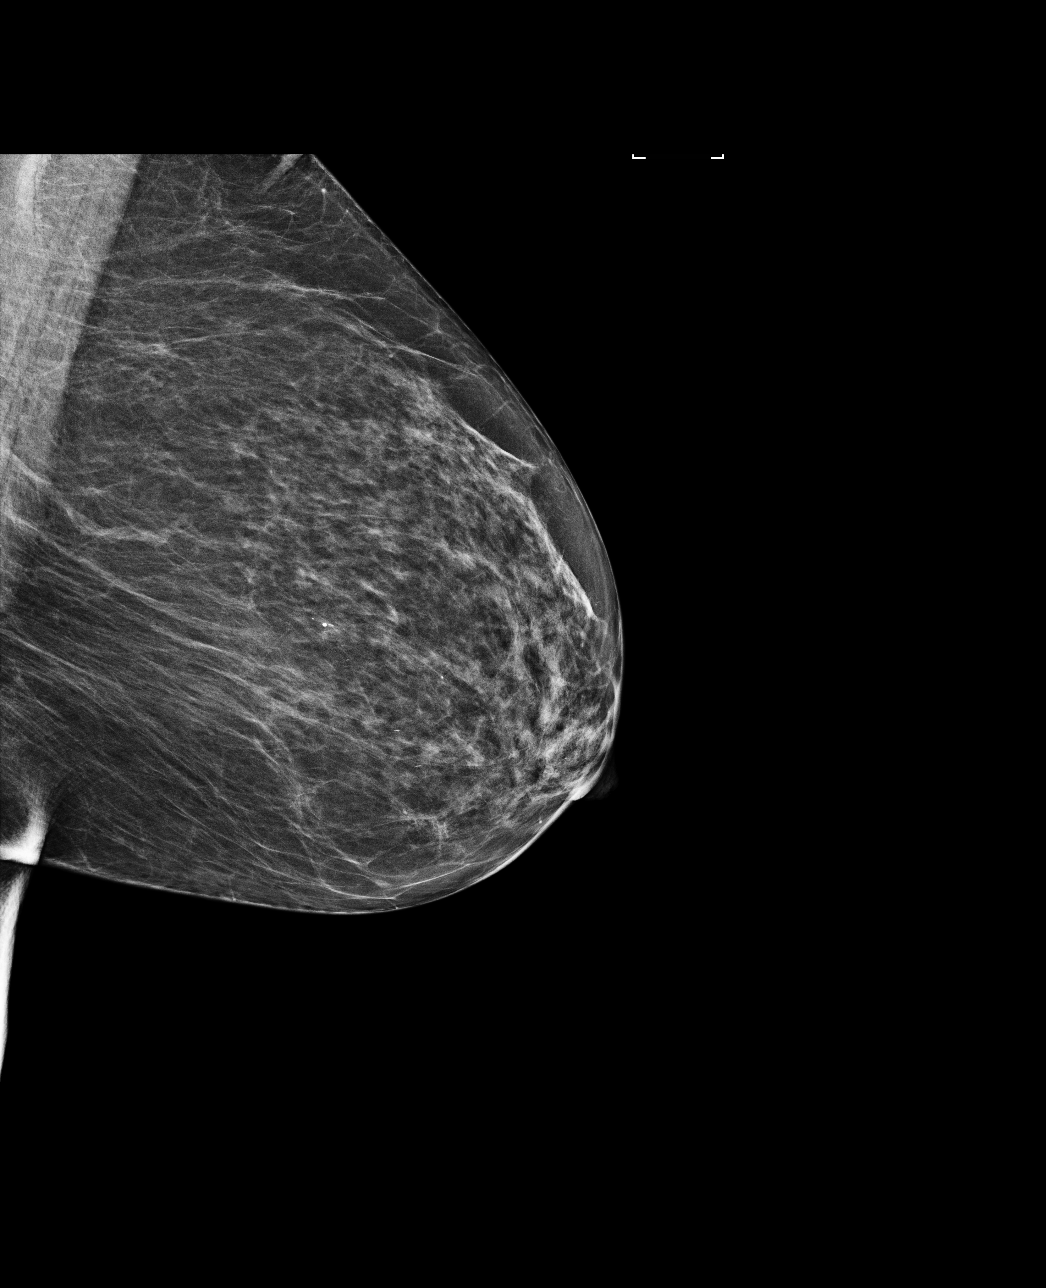

[R MLO]
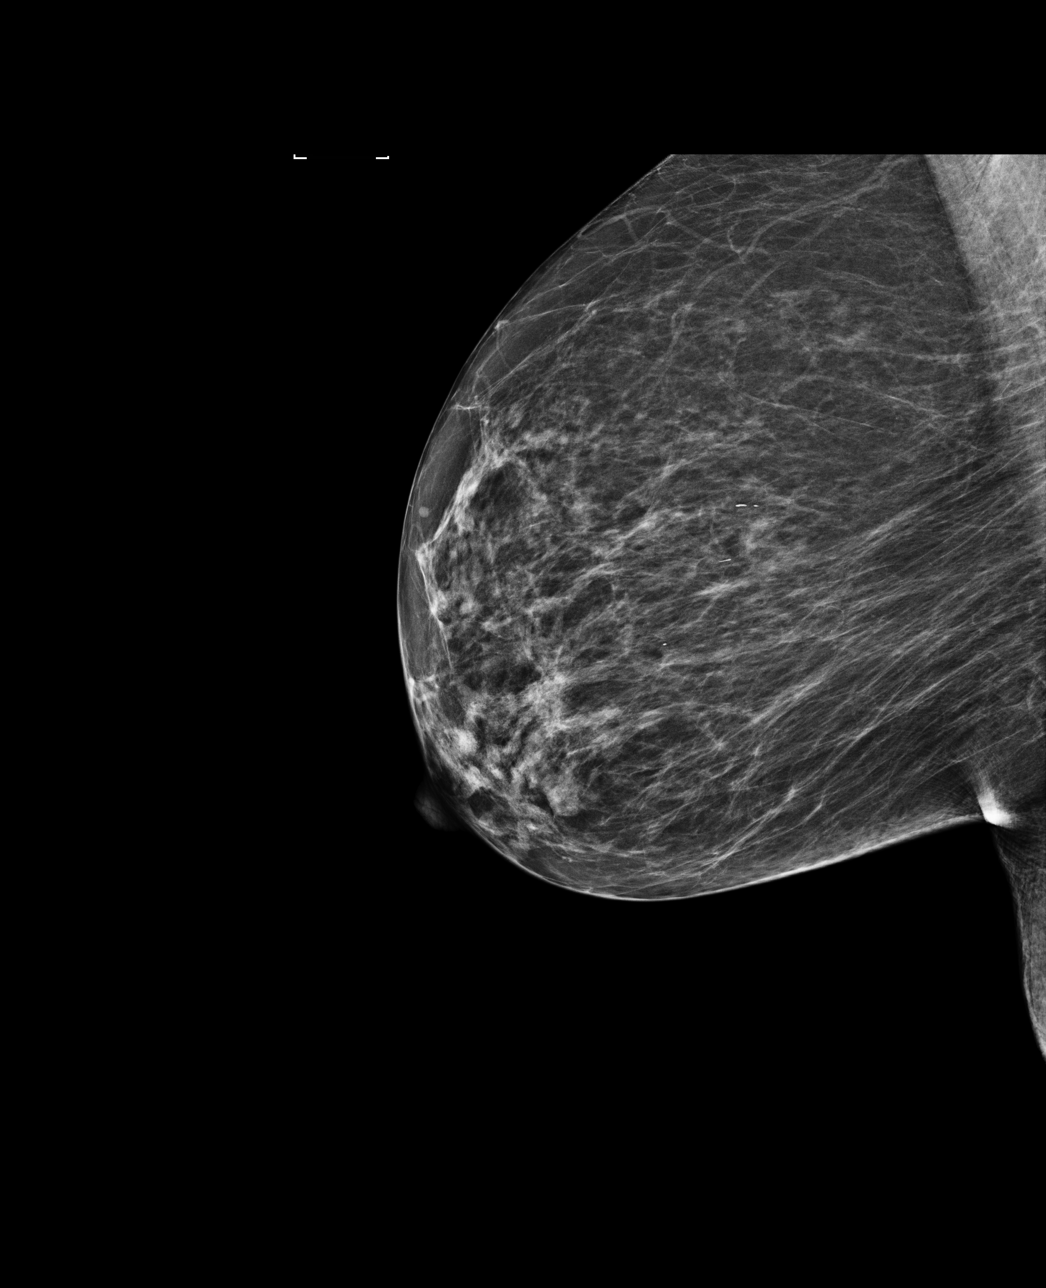

[4 of 4 positions shown; findings below may reference images not displayed]

ACR Breast Density Category b: There are scattered areas of
fibroglandular density.
FINDINGS: There are no findings suspicious for malignancy.
IMPRESSION: No mammographic evidence of malignancy. A result letter of this
screening mammogram will be mailed directly to the patient.

RECOMMENDATION:
Screening mammogram in one year. (Code:[id number]-[id number])

BI-RADS CATEGORY  1: Negative.

## 2022-10-23 DIAGNOSIS — H16223 Keratoconjunctivitis sicca, not specified as Sjogren's, bilateral: Secondary | ICD-10-CM | POA: Diagnosis not present

## 2022-10-28 ENCOUNTER — Ambulatory Visit (INDEPENDENT_AMBULATORY_CARE_PROVIDER_SITE_OTHER): Payer: PPO | Admitting: Family Medicine

## 2022-10-28 ENCOUNTER — Encounter: Payer: Self-pay | Admitting: Family Medicine

## 2022-10-28 VITALS — BP 157/87 | HR 105 | Temp 98.3°F | Wt 110.8 lb

## 2022-10-28 DIAGNOSIS — J209 Acute bronchitis, unspecified: Secondary | ICD-10-CM | POA: Diagnosis not present

## 2022-10-28 MED ORDER — AZITHROMYCIN 250 MG PO TABS: ORAL_TABLET | ORAL | 0 refills | Status: AC

## 2022-10-28 NOTE — Patient Instructions (Addendum)
Allegra plain- recommend start Mucinex DM- when sick with cough and phlegm   Prescription today: Z-pack antibiotic  Acute Bronchitis, Adult  Acute bronchitis is sudden inflammation of the main airways (bronchi) that come off the windpipe (trachea) in the lungs. The swelling causes the airways to get smaller and make more mucus than normal. This can make it hard to breathe and can cause coughing or noisy breathing (wheezing). Acute bronchitis may last several weeks. The cough may last longer. Allergies, asthma, and exposure to smoke may make the condition worse. What are the causes? This condition can be caused by germs and by substances that irritate the lungs, including: Cold and flu viruses. The most common cause of this condition is the virus that causes the common cold. Bacteria. This is less common. Breathing in substances that irritate the lungs, including: Smoke from cigarettes and other forms of tobacco. Dust and pollen. Fumes from household cleaning products, gases, or burned fuel. Indoor or outdoor air pollution. What increases the risk? The following factors may make you more likely to develop this condition: A weak body's defense system, also called the immune system. A condition that affects your lungs and breathing, such as asthma. What are the signs or symptoms? Common symptoms of this condition include: Coughing. This may bring up clear, yellow, or green mucus from your lungs (sputum). Wheezing. Runny or stuffy nose. Having too much mucus in your lungs (chest congestion). Shortness of breath. Aches and pains, including sore throat or chest. How is this diagnosed? This condition is usually diagnosed based on: Your symptoms and medical history. A physical exam. You may also have other tests, including tests to rule out other conditions, such as pneumonia. These tests include: A test of lung function. Test of a mucus sample to look for the presence of  bacteria. Tests to check the oxygen level in your blood. Blood tests. Chest X-ray. How is this treated? Most cases of acute bronchitis clear up over time without treatment. Your health care provider may recommend: Drinking more fluids to help thin your mucus so it is easier to cough up. Taking inhaled medicine (inhaler) to improve air flow in and out of your lungs. Using a vaporizer or a humidifier. These are machines that add water to the air to help you breathe better. Taking a medicine that thins mucus and clears congestion (expectorant). Taking a medicine that prevents or stops coughing (cough suppressant). It is not common to take an antibiotic medicine for this condition. Follow these instructions at home:  Take over-the-counter and prescription medicines only as told by your health care provider. Use an inhaler, vaporizer, or humidifier as told by your health care provider. Take two teaspoons (10 mL) of honey at bedtime to lessen coughing at night. Drink enough fluid to keep your urine pale yellow. Do not use any products that contain nicotine or tobacco. These products include cigarettes, chewing tobacco, and vaping devices, such as e-cigarettes. If you need help quitting, ask your health care provider. Get plenty of rest. Return to your normal activities as told by your health care provider. Ask your health care provider what activities are safe for you. Keep all follow-up visits. This is important. How is this prevented? To lower your risk of getting this condition again: Wash your hands often with soap and water for at least 20 seconds. If soap and water are not available, use hand sanitizer. Avoid contact with people who have cold symptoms. Try not to touch your mouth, nose,  or eyes with your hands. Avoid breathing in smoke or chemical fumes. Breathing smoke or chemical fumes will make your condition worse. Get the flu shot every year. Contact a health care provider if: Your  symptoms do not improve after 2 weeks. You have trouble coughing up the mucus. Your cough keeps you awake at night. You have a fever. Get help right away if you: Cough up blood. Feel pain in your chest. Have severe shortness of breath. Faint or keep feeling like you are going to faint. Have a severe headache. Have a fever or chills that get worse. These symptoms may represent a serious problem that is an emergency. Do not wait to see if the symptoms will go away. Get medical help right away. Call your local emergency services (911 in the [place] Do not drive yourself to the hospital. Summary Acute bronchitis is inflammation of the main airways (bronchi) that come off the windpipe (trachea) in the lungs. The swelling causes the airways to get smaller and make more mucus than normal. Drinking more fluids can help thin your mucus so it is easier to cough up. Take over-the-counter and prescription medicines only as told by your health care provider. Do not use any products that contain nicotine or tobacco. These products include cigarettes, chewing tobacco, and vaping devices, such as e-cigarettes. If you need help quitting, ask your health care provider. Contact a health care provider if your symptoms do not improve after 2 weeks. This information is not intended to replace advice given to you by your health care provider. Make sure you discuss any questions you have with your health care provider. Document Revised: 03/14/2022 Document Reviewed: 04/04/2021 Elsevier Patient Education  [place].
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
## 2022-11-05 ENCOUNTER — Telehealth: Payer: Self-pay

## 2022-11-05 NOTE — Telephone Encounter (Signed)
Pt is scheduled for 11/12/22

## 2022-11-05 NOTE — Telephone Encounter (Signed)
Patient saw Dr.Kuneff for concerns, advised of the following during appt: -Rest, hydrate.  Symptoms can try OTC mucinex (DM if cough) Z-Pak prescribed, take until completed.  Encouraged her to restart antihistamine, Allegra plain should not cause her side effects. If cough present it can last up to 6-8 weeks.  F/U 2 weeks with PCP, if not improved.    [facility] - Client Nonclinical Telephone Record  [facility] [place] [facility] - Client Client Site [place] - Day Provider Crissie Sickles - MD Contact Type Call Who Is Calling Patient / Member / Family / Caregiver Caller Name Nicoletta Dress Phone Number [phone] Patient Name Gabriella Craig Patient DOB 1941-02-26 Call Type Message Only Information Provided Reason for Call Medication Question / Request Initial Comment Caller states her mother got antibiotics and it has been 5 days and she is still not feeling better. She is having yellow mucus when she blows her nose. Additional Comment Office Hours Provided, declined triage Disp. Time Disposition Final User 11/05/2022 12:07:06 PM General Information Provided Yes Marice Potter Call Closed By: Marice Potter Transaction Date/Time: 11/05/2022 12:03:08 PM (ET)

## 2022-11-12 ENCOUNTER — Ambulatory Visit (HOSPITAL_BASED_OUTPATIENT_CLINIC_OR_DEPARTMENT_OTHER)
Admission: RE | Admit: 2022-11-12 | Discharge: 2022-11-12 | Disposition: A | Discharge status: Home / Self Care | Payer: PPO | Source: Ambulatory Visit | Attending: Family Medicine | Admitting: Family Medicine

## 2022-11-12 ENCOUNTER — Ambulatory Visit (INDEPENDENT_AMBULATORY_CARE_PROVIDER_SITE_OTHER): Payer: PPO | Admitting: Family Medicine

## 2022-11-12 ENCOUNTER — Encounter: Payer: Self-pay | Admitting: Family Medicine

## 2022-11-12 VITALS — BP 145/71 | HR 88 | Temp 97.6°F | Ht 61.5 in | Wt 110.6 lb

## 2022-11-12 DIAGNOSIS — Z23 Encounter for immunization: Secondary | ICD-10-CM

## 2022-11-12 DIAGNOSIS — R059 Cough, unspecified: Secondary | ICD-10-CM | POA: Diagnosis not present

## 2022-11-12 DIAGNOSIS — J209 Acute bronchitis, unspecified: Secondary | ICD-10-CM

## 2022-11-12 DIAGNOSIS — R052 Subacute cough: Secondary | ICD-10-CM | POA: Insufficient documentation

## 2022-11-12 MED ORDER — PREDNISONE 20 MG PO TABS: ORAL_TABLET | ORAL | 0 refills | Status: DC

## 2022-11-12 MED ORDER — ALBUTEROL SULFATE HFA 108 (90 BASE) MCG/ACT IN AERS: INHALATION_SPRAY | RESPIRATORY_TRACT | 0 refills | Status: DC

## 2022-11-12 NOTE — Addendum Note (Signed)
Addended by: Deveron Furlong D on: 11/12/2022 11:36 AM   Modules accepted: Orders
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
## 2022-11-13 ENCOUNTER — Other Ambulatory Visit: Payer: Self-pay | Admitting: Family Medicine

## 2022-11-13 DIAGNOSIS — R911 Solitary pulmonary nodule: Secondary | ICD-10-CM

## 2022-11-14 ENCOUNTER — Telehealth (HOSPITAL_BASED_OUTPATIENT_CLINIC_OR_DEPARTMENT_OTHER): Payer: Self-pay

## 2022-11-20 ENCOUNTER — Telehealth: Payer: Self-pay | Admitting: Family Medicine

## 2022-11-20 ENCOUNTER — Ambulatory Visit (HOSPITAL_BASED_OUTPATIENT_CLINIC_OR_DEPARTMENT_OTHER)
Admission: RE | Admit: 2022-11-20 | Discharge: 2022-11-20 | Disposition: A | Discharge status: Home / Self Care | Payer: PPO | Source: Ambulatory Visit | Attending: Family Medicine | Admitting: Family Medicine

## 2022-11-20 DIAGNOSIS — R911 Solitary pulmonary nodule: Secondary | ICD-10-CM | POA: Insufficient documentation

## 2022-11-20 DIAGNOSIS — R918 Other nonspecific abnormal finding of lung field: Secondary | ICD-10-CM | POA: Diagnosis not present

## 2022-11-20 MED ORDER — IOHEXOL 300 MG/ML  SOLN
75.0000 mL | Freq: Once | INTRAMUSCULAR | Status: AC | PRN
  Administered 2022-11-20: 75 mL via INTRAVENOUS

## 2022-11-20 NOTE — Telephone Encounter (Signed)
Please advise in PCP absence.  

## 2022-11-20 NOTE — Telephone Encounter (Signed)
Should not be a problem.ok to take routine meds.

## 2022-11-20 NOTE — Telephone Encounter (Signed)
Pt advised of recommendations.  

## 2022-11-20 NOTE — Telephone Encounter (Signed)
Pt is scheduled for CT scan today, and would like to know if she can take her medications prior to the CT scan at 2 pm.  Please advise patient

## 2022-11-28 ENCOUNTER — Other Ambulatory Visit: Payer: Self-pay | Admitting: Family Medicine

## 2022-12-12 ENCOUNTER — Encounter: Payer: Self-pay | Admitting: Family Medicine

## 2022-12-12 ENCOUNTER — Ambulatory Visit (INDEPENDENT_AMBULATORY_CARE_PROVIDER_SITE_OTHER): Payer: PPO | Admitting: Family Medicine

## 2022-12-12 VITALS — BP 138/72 | HR 84 | Temp 98.0°F | Ht 61.5 in | Wt 108.6 lb

## 2022-12-12 DIAGNOSIS — I10 Essential (primary) hypertension: Secondary | ICD-10-CM | POA: Diagnosis not present

## 2022-12-12 DIAGNOSIS — E559 Vitamin D deficiency, unspecified: Secondary | ICD-10-CM | POA: Diagnosis not present

## 2022-12-12 DIAGNOSIS — R051 Acute cough: Secondary | ICD-10-CM

## 2022-12-12 DIAGNOSIS — E7800 Pure hypercholesterolemia, unspecified: Secondary | ICD-10-CM

## 2022-12-12 LAB — BASIC METABOLIC PANEL
BUN: 23 mg/dL (ref 6–23)
CO2: 33 mEq/L — ABNORMAL HIGH (ref 19–32)
Calcium: 9.9 mg/dL (ref 8.4–10.5)
Chloride: 105 mEq/L (ref 96–112)
Creatinine, Ser: 0.78 mg/dL (ref 0.40–1.20)
GFR: 70.34 mL/min (ref >60.00)
Glucose, Bld: 95 mg/dL (ref 70–99)
Potassium: 4.3 mEq/L (ref 3.5–5.1)
Sodium: 147 mEq/L — ABNORMAL HIGH (ref 135–145)

## 2022-12-12 LAB — LIPID PANEL
Cholesterol: 167 mg/dL (ref 0–200)
HDL: 73.7 mg/dL (ref >39.00)
LDL Cholesterol: 70 mg/dL (ref 0–99)
NonHDL: 93.46
Total CHOL/HDL Ratio: 2
Triglycerides: 117 mg/dL (ref 0.0–149.0)
VLDL: 23.4 mg/dL (ref 0.0–40.0)

## 2022-12-12 MED ORDER — ATORVASTATIN CALCIUM 20 MG PO TABS: 20.0000 mg | ORAL_TABLET | Freq: Every day | ORAL | 1 refills | Status: DC

## 2022-12-12 MED ORDER — HYDROCHLOROTHIAZIDE 25 MG PO TABS: 25.0000 mg | ORAL_TABLET | Freq: Every day | ORAL | 1 refills | Status: DC
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
## 2022-12-13 LAB — VITAMIN D 25 HYDROXY (VIT D DEFICIENCY, FRACTURES): VITD: 51.33 ng/mL (ref 30.00–100.00)

## 2023-01-13 DIAGNOSIS — D3132 Benign neoplasm of left choroid: Secondary | ICD-10-CM | POA: Diagnosis not present

## 2023-01-13 DIAGNOSIS — H04123 Dry eye syndrome of bilateral lacrimal glands: Secondary | ICD-10-CM | POA: Diagnosis not present

## 2023-01-13 DIAGNOSIS — H26491 Other secondary cataract, right eye: Secondary | ICD-10-CM | POA: Diagnosis not present

## 2023-01-13 DIAGNOSIS — H353131 Nonexudative age-related macular degeneration, bilateral, early dry stage: Secondary | ICD-10-CM | POA: Diagnosis not present

## 2023-01-13 DIAGNOSIS — H0102A Squamous blepharitis right eye, upper and lower eyelids: Secondary | ICD-10-CM | POA: Diagnosis not present

## 2023-05-03 DIAGNOSIS — J020 Streptococcal pharyngitis: Secondary | ICD-10-CM | POA: Diagnosis not present

## 2023-06-11 ENCOUNTER — Other Ambulatory Visit: Payer: Self-pay | Admitting: Family Medicine

## 2023-06-18 ENCOUNTER — Other Ambulatory Visit: Payer: Self-pay | Admitting: Family Medicine

## 2023-06-18 ENCOUNTER — Encounter: Payer: PPO | Admitting: Family Medicine

## 2023-06-18 DIAGNOSIS — Z1231 Encounter for screening mammogram for malignant neoplasm of breast: Secondary | ICD-10-CM

## 2023-06-23 NOTE — Patient Instructions (Signed)

## 2023-06-26 ENCOUNTER — Encounter: Payer: Self-pay | Admitting: Family Medicine

## 2023-06-26 ENCOUNTER — Ambulatory Visit: Payer: PPO | Admitting: Family Medicine

## 2023-06-26 VITALS — BP 119/76 | HR 85 | Temp 98.0°F | Wt 108.2 lb

## 2023-06-26 DIAGNOSIS — J4541 Moderate persistent asthma with (acute) exacerbation: Secondary | ICD-10-CM

## 2023-06-26 DIAGNOSIS — I10 Essential (primary) hypertension: Secondary | ICD-10-CM | POA: Diagnosis not present

## 2023-06-26 DIAGNOSIS — E7800 Pure hypercholesterolemia, unspecified: Secondary | ICD-10-CM | POA: Diagnosis not present

## 2023-06-26 LAB — COMPREHENSIVE METABOLIC PANEL
ALT: 20 U/L (ref 0–35)
AST: 28 U/L (ref 0–37)
Albumin: 3.9 g/dL (ref 3.5–5.2)
Alkaline Phosphatase: 81 U/L (ref 39–117)
BUN: 15 mg/dL (ref 6–23)
CO2: 32 mEq/L (ref 19–32)
Calcium: 9.5 mg/dL (ref 8.4–10.5)
Chloride: 102 mEq/L (ref 96–112)
Creatinine, Ser: 0.74 mg/dL (ref 0.40–1.20)
GFR: 74.65 mL/min (ref >60.00)
Glucose, Bld: 96 mg/dL (ref 70–99)
Potassium: 4.3 mEq/L (ref 3.5–5.1)
Sodium: 141 mEq/L (ref 135–145)
Total Bilirubin: 0.6 mg/dL (ref 0.2–1.2)
Total Protein: 6.9 g/dL (ref 6.0–8.3)

## 2023-06-26 LAB — LIPID PANEL
Cholesterol: 144 mg/dL (ref 0–200)
HDL: 64 mg/dL (ref >39.00)
LDL Cholesterol: 64 mg/dL (ref 0–99)
NonHDL: 79.64
Total CHOL/HDL Ratio: 2
Triglycerides: 79 mg/dL (ref 0.0–149.0)
VLDL: 15.8 mg/dL (ref 0.0–40.0)

## 2023-06-26 MED ORDER — ARNUITY ELLIPTA 100 MCG/ACT IN AEPB: INHALATION_SPRAY | RESPIRATORY_TRACT | 5 refills | Status: DC

## 2023-06-26 MED ORDER — ATORVASTATIN CALCIUM 20 MG PO TABS: 20.0000 mg | ORAL_TABLET | Freq: Every day | ORAL | 3 refills | Status: DC

## 2023-06-26 MED ORDER — HYDROCHLOROTHIAZIDE 25 MG PO TABS: 25.0000 mg | ORAL_TABLET | Freq: Every day | ORAL | 3 refills | Status: DC

## 2023-06-26 MED ORDER — PREDNISONE 20 MG PO TABS: ORAL_TABLET | ORAL | 0 refills | Status: DC

## 2023-06-26 MED ORDER — ALBUTEROL SULFATE HFA 108 (90 BASE) MCG/ACT IN AERS: INHALATION_SPRAY | RESPIRATORY_TRACT | 0 refills | Status: DC
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
## 2023-07-08 ENCOUNTER — Ambulatory Visit (INDEPENDENT_AMBULATORY_CARE_PROVIDER_SITE_OTHER): Payer: PPO | Admitting: Family Medicine

## 2023-07-08 ENCOUNTER — Encounter: Payer: Self-pay | Admitting: Family Medicine

## 2023-07-08 VITALS — BP 118/74 | HR 82 | Wt 108.4 lb

## 2023-07-08 DIAGNOSIS — J4541 Moderate persistent asthma with (acute) exacerbation: Secondary | ICD-10-CM

## 2023-07-08 MED ORDER — DOXYCYCLINE HYCLATE 100 MG PO CAPS: 100.0000 mg | ORAL_CAPSULE | Freq: Two times a day (BID) | ORAL | 0 refills | Status: AC

## 2023-07-08 MED ORDER — PREDNISONE 10 MG PO TABS
ORAL_TABLET | ORAL | 0 refills | Status: DC
Start: 2023-07-08 — End: 2023-07-23
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
## 2023-07-09 ENCOUNTER — Ambulatory Visit (HOSPITAL_BASED_OUTPATIENT_CLINIC_OR_DEPARTMENT_OTHER)
Admission: RE | Admit: 2023-07-09 | Discharge: 2023-07-09 | Disposition: A | Discharge status: Home / Self Care | Payer: PPO | Source: Ambulatory Visit | Attending: Family Medicine | Admitting: Family Medicine

## 2023-07-09 DIAGNOSIS — R062 Wheezing: Secondary | ICD-10-CM | POA: Diagnosis not present

## 2023-07-09 DIAGNOSIS — R059 Cough, unspecified: Secondary | ICD-10-CM | POA: Diagnosis not present

## 2023-07-09 DIAGNOSIS — J4541 Moderate persistent asthma with (acute) exacerbation: Secondary | ICD-10-CM | POA: Insufficient documentation

## 2023-07-11 ENCOUNTER — Ambulatory Visit
Admission: RE | Admit: 2023-07-11 | Discharge: 2023-07-11 | Disposition: A | Discharge status: Home / Self Care | Payer: PPO | Source: Ambulatory Visit | Attending: Family Medicine | Admitting: Family Medicine

## 2023-07-11 DIAGNOSIS — Z1231 Encounter for screening mammogram for malignant neoplasm of breast: Secondary | ICD-10-CM | POA: Diagnosis not present

## 2023-07-13 ENCOUNTER — Other Ambulatory Visit: Payer: Self-pay | Admitting: Family Medicine

## 2023-07-22 ENCOUNTER — Ambulatory Visit: Payer: PPO | Admitting: Family Medicine

## 2023-07-24 ENCOUNTER — Encounter: Payer: Self-pay | Admitting: Family Medicine

## 2023-07-24 ENCOUNTER — Ambulatory Visit (INDEPENDENT_AMBULATORY_CARE_PROVIDER_SITE_OTHER): Payer: PPO | Admitting: Family Medicine

## 2023-07-24 ENCOUNTER — Other Ambulatory Visit: Payer: Self-pay | Admitting: Family Medicine

## 2023-07-24 VITALS — BP 134/80 | HR 95 | Wt 110.2 lb

## 2023-07-24 DIAGNOSIS — J45901 Unspecified asthma with (acute) exacerbation: Secondary | ICD-10-CM

## 2023-07-24 MED ORDER — FLUTICASONE PROPIONATE HFA 44 MCG/ACT IN AERO: 1.0000 | INHALATION_SPRAY | Freq: Every day | RESPIRATORY_TRACT | 0 refills | Status: DC

## 2023-07-24 MED ORDER — BECLOMETHASONE DIPROP HFA 40 MCG/ACT IN AERB: 1.0000 | INHALATION_SPRAY | Freq: Two times a day (BID) | RESPIRATORY_TRACT | 0 refills | Status: DC

## 2023-07-24 NOTE — Telephone Encounter (Signed)
Confirmed with pharmacy, Flovent is not covered with pt's insurance. No alternative was provided.  Please further advise.
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
## 2023-07-24 NOTE — Telephone Encounter (Signed)
OK, will try QVAR (beclamethasone)

## 2023-07-30 ENCOUNTER — Ambulatory Visit (INDEPENDENT_AMBULATORY_CARE_PROVIDER_SITE_OTHER): Payer: PPO

## 2023-07-30 VITALS — Wt 110.0 lb

## 2023-07-30 DIAGNOSIS — Z0000 Encounter for general adult medical examination without abnormal findings: Secondary | ICD-10-CM
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
## 2023-07-30 NOTE — Patient Instructions (Signed)
Gabriella Craig , Thank you for taking time to come for your Medicare Wellness Visit. I appreciate your ongoing commitment to your health goals. Please review the following plan we discussed and let me know if I can assist you in the future.   Referrals/Orders/Follow-Ups/Clinician Recommendations: stay healthy and active   This is a list of the screening recommended for you and due dates:  Health Maintenance  Topic Date Due   COVID-19 Vaccine (4 - 2023-24 season) 08/16/2022   Flu Shot  07/17/2023   Mammogram  07/10/2024   Medicare Annual Wellness Visit  07/29/2024   DTaP/Tdap/Td vaccine (2 - Td or Tdap) 12/16/2025   Pneumonia Vaccine  Completed   DEXA scan (bone density measurement)  Completed   Zoster (Shingles) Vaccine  Completed   HPV Vaccine  Aged Out    Advanced directives: (Copy Requested) Please bring a copy of your health care power of attorney and living will to the office to be added to your chart at your convenience.  Next Medicare Annual Wellness Visit scheduled for next year: Yes  Preventive Care 41 Years and Older, Female Preventive care refers to lifestyle choices and visits with your health care provider that can promote health and wellness. What does preventive care include? A yearly physical exam. This is also called an annual well check. Dental exams once or twice a year. Routine eye exams. Ask your health care provider how often you should have your eyes checked. Personal lifestyle choices, including: Daily care of your teeth and gums. Regular physical activity. Eating a healthy diet. Avoiding tobacco and drug use. Limiting alcohol use. Practicing safe sex. Taking low-dose aspirin every day. Taking vitamin and mineral supplements as recommended by your health care provider. What happens during an annual well check? The services and screenings done by your health care provider during your annual well check will depend on your age, overall health, lifestyle risk  factors, and family history of disease. Counseling  Your health care provider may ask you questions about your: Alcohol use. Tobacco use. Drug use. Emotional well-being. Home and relationship well-being. Sexual activity. Eating habits. History of falls. Memory and ability to understand (cognition). Work and work Astronomer. Reproductive health. Screening  You may have the following tests or measurements: Height, weight, and BMI. Blood pressure. Lipid and cholesterol levels. These may be checked every 5 years, or more frequently if you are over 78 years old. Skin check. Lung cancer screening. You may have this screening every year starting at age 28 if you have a 30-pack-year history of smoking and currently smoke or have quit within the past 15 years. Fecal occult blood test (FOBT) of the stool. You may have this test every year starting at age 85. Flexible sigmoidoscopy or colonoscopy. You may have a sigmoidoscopy every 5 years or a colonoscopy every 10 years starting at age 57. Hepatitis C blood test. Hepatitis B blood test. Sexually transmitted disease (STD) testing. Diabetes screening. This is done by checking your blood sugar (glucose) after you have not eaten for a while (fasting). You may have this done every 1-3 years. Bone density scan. This is done to screen for osteoporosis. You may have this done starting at age 5. Mammogram. This may be done every 1-2 years. Talk to your health care provider about how often you should have regular mammograms. Talk with your health care provider about your test results, treatment options, and if necessary, the need for more tests. Vaccines  Your health care provider may  recommend certain vaccines, such as: Influenza vaccine. This is recommended every year. Tetanus, diphtheria, and acellular pertussis (Tdap, Td) vaccine. You may need a Td booster every 10 years. Zoster vaccine. You may need this after age 56. Pneumococcal 13-valent  conjugate (PCV13) vaccine. One dose is recommended after age 19. Pneumococcal polysaccharide (PPSV23) vaccine. One dose is recommended after age 67. Talk to your health care provider about which screenings and vaccines you need and how often you need them. This information is not intended to replace advice given to you by your health care provider. Make sure you discuss any questions you have with your health care provider. Document Released: 12/29/2015 Document Revised: 08/21/2016 Document Reviewed: 10/03/2015 Elsevier Interactive Patient Education  2017 [employer].  Fall Prevention in the Home Falls can cause injuries. They can happen to people of all ages. There are many things you can do to make your home safe and to help prevent falls. What can I do on the outside of my home? Regularly fix the edges of walkways and driveways and fix any cracks. Remove anything that might make you trip as you walk through a door, such as a raised step or threshold. Trim any bushes or trees on the path to your home. Use bright outdoor lighting. Clear any walking paths of anything that might make someone trip, such as rocks or tools. Regularly check to see if handrails are loose or broken. Make sure that both sides of any steps have handrails. Any raised decks and porches should have guardrails on the edges. Have any leaves, snow, or ice cleared regularly. Use sand or salt on walking paths during winter. Clean up any spills in your garage right away. This includes oil or grease spills. What can I do in the bathroom? Use night lights. Install grab bars by the toilet and in the tub and shower. Do not use towel bars as grab bars. Use non-skid mats or decals in the tub or shower. If you need to sit down in the shower, use a plastic, non-slip stool. Keep the floor dry. Clean up any water that spills on the floor as soon as it happens. Remove soap buildup in the tub or shower regularly. Attach bath mats  securely with double-sided non-slip rug tape. Do not have throw rugs and other things on the floor that can make you trip. What can I do in the bedroom? Use night lights. Make sure that you have a light by your bed that is easy to reach. Do not use any sheets or blankets that are too big for your bed. They should not hang down onto the floor. Have a firm chair that has side arms. You can use this for support while you get dressed. Do not have throw rugs and other things on the floor that can make you trip. What can I do in the kitchen? Clean up any spills right away. Avoid walking on wet floors. Keep items that you use a lot in easy-to-reach places. If you need to reach something above you, use a strong step stool that has a grab bar. Keep electrical cords out of the way. Do not use floor polish or wax that makes floors slippery. If you must use wax, use non-skid floor wax. Do not have throw rugs and other things on the floor that can make you trip. What can I do with my stairs? Do not leave any items on the stairs. Make sure that there are handrails on both sides of  the stairs and use them. Fix handrails that are broken or loose. Make sure that handrails are as long as the stairways. Check any carpeting to make sure that it is firmly attached to the stairs. Fix any carpet that is loose or worn. Avoid having throw rugs at the top or bottom of the stairs. If you do have throw rugs, attach them to the floor with carpet tape. Make sure that you have a light switch at the top of the stairs and the bottom of the stairs. If you do not have them, ask someone to add them for you. What else can I do to help prevent falls? Wear shoes that: Do not have high heels. Have rubber bottoms. Are comfortable and fit you well. Are closed at the toe. Do not wear sandals. If you use a stepladder: Make sure that it is fully opened. Do not climb a closed stepladder. Make sure that both sides of the stepladder  are locked into place. Ask someone to hold it for you, if possible. Clearly mark and make sure that you can see: Any grab bars or handrails. First and last steps. Where the edge of each step is. Use tools that help you move around (mobility aids) if they are needed. These include: Canes. Walkers. Scooters. Crutches. Turn on the lights when you go into a dark area. Replace any light bulbs as soon as they burn out. Set up your furniture so you have a clear path. Avoid moving your furniture around. If any of your floors are uneven, fix them. If there are any pets around you, be aware of where they are. Review your medicines with your doctor. Some medicines can make you feel dizzy. This can increase your chance of falling. Ask your doctor what other things that you can do to help prevent falls. This information is not intended to replace advice given to you by your health care provider. Make sure you discuss any questions you have with your health care provider. Document Released: 09/28/2009 Document Revised: 05/09/2016 Document Reviewed: 01/06/2015 Elsevier Interactive Patient Education  2017 [employer].

## 2023-07-31 ENCOUNTER — Encounter: Payer: Self-pay | Admitting: Allergy

## 2023-07-31 ENCOUNTER — Ambulatory Visit: Payer: PPO | Admitting: Allergy

## 2023-07-31 VITALS — BP 150/70 | HR 91 | Temp 97.8°F | Resp 16 | Ht 61.5 in | Wt 109.5 lb

## 2023-07-31 DIAGNOSIS — J310 Chronic rhinitis: Secondary | ICD-10-CM | POA: Diagnosis not present

## 2023-07-31 DIAGNOSIS — R053 Chronic cough: Secondary | ICD-10-CM

## 2023-07-31 DIAGNOSIS — J4540 Moderate persistent asthma, uncomplicated: Secondary | ICD-10-CM | POA: Diagnosis not present

## 2023-07-31 MED ORDER — BREZTRI AEROSPHERE 160-9-4.8 MCG/ACT IN AERO: 2.0000 | INHALATION_SPRAY | Freq: Two times a day (BID) | RESPIRATORY_TRACT | 3 refills | Status: DC

## 2023-07-31 NOTE — Patient Instructions (Addendum)
Today's skin testing:  Negative to indoor/outdoor allergens and common foods.   Results given.  Coughing If no improvement with below recommendations then will refer to ENT next. Don't eat dairy 1-2 hours before bedtime as it can thicken the mucous. Stop antihistamines. May take mucinex twice a day with plenty of water to loosen up the mucous. Take some honey to soothe the throat.   You can get your ducts cleaned and check for radon levels in your home if you are concerned.   Daily controller medication(s): start Breztri 2 puffs twice a day with spacer and rinse mouth afterwards. Samples given and demonstrated proper use.   Check the pricing for the following inhalers: Dulera Advair HFA Symbicort  Spacer given and demonstrated proper use with inhaler. Patient understood technique and all questions/concerned were addressed.   May use albuterol rescue inhaler 2 puffs every 4 to 6 hours as needed for shortness of breath, chest tightness, coughing, and wheezing.  Monitor frequency of use - if you need to use it more than twice per week on a consistent basis let us know.  Breathing control goals:  Full participation in all desired activities (may need albuterol before activity) Albuterol use two times or less a week on average (not counting use with activity) Cough interfering with sleep two times or less a month Oral steroids no more than once a year No hospitalizations   Heartburn See handout for lifestyle and dietary modifications.  Return in about 2 months (around 09/30/2023). Or sooner if needed.

## 2023-10-01 NOTE — Progress Notes (Unsigned)
Follow Up Note  RE: Gabriella Craig MRN: [id number] DOB: Jan 18, 1941 Date of Office Visit: 10/02/2023  Referring provider: Jeoffrey Massed, MD Primary care provider: Jeoffrey Massed, MD  Chief Complaint: No chief complaint on file.  History of Present Illness: I had the pleasure of seeing Gabriella Craig for a follow up visit at the [facility] on 10/01/2023. She is a 82 y.o. female, who is being followed for chronic cough. Her previous allergy office visit was on 07/31/2023 with Dr. Selena Batten. Today is a regular follow up visit.  Discussed the use of AI scribe software for clinical note transcription with the patient, who gave verbal consent to proceed.  History of Present Illness            Chronic cough Daily coughing with thick phlegm since 2019 after Covid-19 infection. Minimal benefit with steroids and prednisone. CXR and CT chest unremarkable. Cardiac work up in 2021 unremarkable. Not sure if inhalers are effective. Denies reflux, PND. Concerned about allergies.  Today's skin prick testing:  Negative to indoor/outdoor allergens and common foods.  Today's spirometry showed: normal pattern with no improvement in FEV1 post bronchodilator treatment. Clinically feeling slightly improved.  Etiology unclear.  If no improvement with below recommendations then will refer to ENT next. Avoid dairy products 1-2 hours before bedtime as it can thicken the mucous. Stop antihistamines as ineffective. May take mucinex twice a day with plenty of water to loosen up the mucous. Take some honey to soothe the throat.  You can get your ducts cleaned and check for radon levels in your home if you are concerned.  Daily controller medication(s): start Breztri 2 puffs twice a day with spacer and rinse mouth afterwards. Samples given and demonstrated proper use.  Spacer given and demonstrated proper use with inhaler. Patient understood technique and all questions/concerned were  addressed.  May use albuterol rescue inhaler 2 puffs every 4 to 6 hours as needed for shortness of breath, chest tightness, coughing, and wheezing.  Monitor frequency of use - if you need to use it more than twice per week on a consistent basis let us know.    Possible heartburn See handout for lifestyle and dietary modifications.  Assessment and Plan: Gabriella Craig is a 82 y.o. female with: *** Assessment and Plan              No follow-ups on file.  No orders of the defined types were placed in this encounter.  Lab Orders  No laboratory test(s) ordered today    Diagnostics: Spirometry:  Tracings reviewed. Her effort: {Blank single:19197::"Good reproducible efforts.","It was hard to get consistent efforts and there is a question as to whether this reflects a maximal maneuver.","Poor effort, data can not be interpreted."} FVC: ***L FEV1: ***L, ***% predicted FEV1/FVC ratio: ***% Interpretation: {Blank single:19197::"Spirometry consistent with mild obstructive disease","Spirometry consistent with moderate obstructive disease","Spirometry consistent with severe obstructive disease","Spirometry consistent with possible restrictive disease","Spirometry consistent with mixed obstructive and restrictive disease","Spirometry uninterpretable due to technique","Spirometry consistent with normal pattern","No overt abnormalities noted given today's efforts"}.  Please see scanned spirometry results for details.  Skin Testing: {Blank single:19197::"Select foods","Environmental allergy panel","Environmental allergy panel and select foods","Food allergy panel","None","Deferred due to recent antihistamines use"}. *** Results discussed with patient/family.   Medication List:  Current Outpatient Medications  Medication Sig Dispense Refill  . albuterol (VENTOLIN HFA) 108 (90 Base) MCG/ACT inhaler INHALE 2 PUFFS EVERY 6 HOURS AS NEEDED FOR COUGH OR WHEEZING 18 each 0  .  Ascorbic Acid (VITAMIN C PO)  Take by mouth daily.    Marland Kitchen aspirin 81 MG chewable tablet Chew 81 mg by mouth daily.    Marland Kitchen atorvastatin (LIPITOR) 20 MG tablet Take 1 tablet (20 mg total) by mouth daily. 90 tablet 3  . Budeson-Glycopyrrol-Formoterol (BREZTRI AEROSPHERE) 160-9-4.8 MCG/ACT AERO Inhale 2 puffs into the lungs in the morning and at bedtime. with spacer and rinse mouth afterwards. 10.7 g 3  . CALCIUM PO Take by mouth daily.    . Cyanocobalamin (B-12 PO) Take by mouth daily.    . hydrochlorothiazide (HYDRODIURIL) 25 MG tablet Take 1 tablet (25 mg total) by mouth daily. 90 tablet 3  . Multiple Vitamin (MULTIVITAMIN) tablet Take 1 tablet by mouth daily.    . Multiple Vitamins-Minerals (ZINC PO) Take by mouth daily.    . Omega-3 Fatty Acids (FISH OIL PO) Take 1 capsule by mouth daily.     No current facility-administered medications for this visit.   Allergies: Allergies  Allergen Reactions  . Ultram [Tramadol Hcl] Nausea And Vomiting   I reviewed her past medical history, social history, family history, and environmental history and no significant changes have been reported from her previous visit.  Review of Systems  Constitutional:  Negative for appetite change, chills, fever and unexpected weight change.  HENT:  Negative for congestion and rhinorrhea.   Eyes:  Negative for itching.  Respiratory:  Positive for cough. Negative for chest tightness, shortness of breath and wheezing.   Cardiovascular:  Negative for chest pain.  Gastrointestinal:  Negative for abdominal pain.  Genitourinary:  Negative for difficulty urinating.  Skin:  Negative for rash.  Allergic/Immunologic: Negative for environmental allergies and food allergies.  Neurological:  Negative for headaches.    Objective: There were no vitals taken for this visit. There is no height or weight on file to calculate BMI. Physical Exam Vitals and nursing note reviewed.  Constitutional:      Appearance: Normal appearance. She is well-developed.   HENT:     Head: Normocephalic and atraumatic.     Right Ear: Tympanic membrane and external ear normal.     Left Ear: Tympanic membrane and external ear normal.     Nose: Nose normal.     Mouth/Throat:     Mouth: Mucous membranes are moist.     Pharynx: Oropharynx is clear.  Eyes:     Conjunctiva/sclera: Conjunctivae normal.  Cardiovascular:     Rate and Rhythm: Normal rate and regular rhythm.     Heart sounds: Normal heart sounds. No murmur heard.    No friction rub. No gallop.  Pulmonary:     Effort: Pulmonary effort is normal.     Breath sounds: Normal breath sounds. No wheezing, rhonchi or rales.  Musculoskeletal:     Cervical back: Neck supple.  Skin:    General: Skin is warm.     Findings: No rash.  Neurological:     Mental Status: She is alert and oriented to person, place, and time.  Psychiatric:        Behavior: Behavior normal.  Previous notes and tests were reviewed. The plan was reviewed with the patient/family, and all questions/concerned were addressed.  It was my pleasure to see Gabriella Craig today and participate in her care. Please feel free to contact me with any questions or concerns.  Sincerely,  Wyline Mood, DO Allergy & Immunology  [facility] office: [phone] [facility] office: [phone]

## 2023-10-02 ENCOUNTER — Ambulatory Visit (INDEPENDENT_AMBULATORY_CARE_PROVIDER_SITE_OTHER): Payer: PPO

## 2023-10-02 ENCOUNTER — Ambulatory Visit: Payer: PPO | Admitting: Allergy

## 2023-10-02 ENCOUNTER — Encounter: Payer: Self-pay | Admitting: Allergy

## 2023-10-02 VITALS — BP 132/60 | HR 72 | Temp 97.5°F | Resp 18

## 2023-10-02 DIAGNOSIS — R053 Chronic cough: Secondary | ICD-10-CM

## 2023-10-02 DIAGNOSIS — Z23 Encounter for immunization: Secondary | ICD-10-CM | POA: Diagnosis not present

## 2023-10-02 NOTE — Patient Instructions (Addendum)
Coughing I'm glad that the coughing resolved.  You can try to reintroduce peanut butter back into your diet. If no issues after one week then okay to reintroduce cow's milk products back into your diet.  If you notice issues with the above foods then stop and let us know.   No need to take any medications/inhalers from my perspective.  Okay to stop [employer].  Follow up as needed.

## 2024-01-16 DIAGNOSIS — L84 Corns and callosities: Secondary | ICD-10-CM | POA: Diagnosis not present

## 2024-01-16 DIAGNOSIS — I739 Peripheral vascular disease, unspecified: Secondary | ICD-10-CM | POA: Diagnosis not present

## 2024-03-11 DIAGNOSIS — M2042 Other hammer toe(s) (acquired), left foot: Secondary | ICD-10-CM | POA: Diagnosis not present

## 2024-03-11 DIAGNOSIS — M2011 Hallux valgus (acquired), right foot: Secondary | ICD-10-CM | POA: Diagnosis not present

## 2024-03-11 DIAGNOSIS — M7742 Metatarsalgia, left foot: Secondary | ICD-10-CM | POA: Diagnosis not present

## 2024-03-11 DIAGNOSIS — M79674 Pain in right toe(s): Secondary | ICD-10-CM | POA: Diagnosis not present

## 2024-03-11 DIAGNOSIS — L600 Ingrowing nail: Secondary | ICD-10-CM | POA: Diagnosis not present

## 2024-03-11 DIAGNOSIS — M7741 Metatarsalgia, right foot: Secondary | ICD-10-CM | POA: Diagnosis not present

## 2024-03-11 DIAGNOSIS — M2041 Other hammer toe(s) (acquired), right foot: Secondary | ICD-10-CM | POA: Diagnosis not present

## 2024-03-11 DIAGNOSIS — M2012 Hallux valgus (acquired), left foot: Secondary | ICD-10-CM | POA: Diagnosis not present

## 2024-03-11 DIAGNOSIS — I872 Venous insufficiency (chronic) (peripheral): Secondary | ICD-10-CM | POA: Diagnosis not present

## 2024-05-28 DIAGNOSIS — H04123 Dry eye syndrome of bilateral lacrimal glands: Secondary | ICD-10-CM | POA: Diagnosis not present

## 2024-06-13 ENCOUNTER — Other Ambulatory Visit: Payer: Self-pay | Admitting: Family Medicine

## 2024-06-13 DIAGNOSIS — I10 Essential (primary) hypertension: Secondary | ICD-10-CM

## 2024-06-13 DIAGNOSIS — E7800 Pure hypercholesterolemia, unspecified: Secondary | ICD-10-CM

## 2024-06-24 ENCOUNTER — Encounter: Payer: Self-pay | Admitting: Family Medicine

## 2024-06-24 ENCOUNTER — Ambulatory Visit: Admitting: Family Medicine

## 2024-06-24 VITALS — BP 123/70 | HR 79 | Temp 96.8°F | Ht 62.0 in | Wt 110.0 lb

## 2024-06-24 DIAGNOSIS — J45991 Cough variant asthma: Secondary | ICD-10-CM

## 2024-06-24 DIAGNOSIS — Z0000 Encounter for general adult medical examination without abnormal findings: Secondary | ICD-10-CM | POA: Diagnosis not present

## 2024-06-24 DIAGNOSIS — I10 Essential (primary) hypertension: Secondary | ICD-10-CM | POA: Diagnosis not present

## 2024-06-24 DIAGNOSIS — E7800 Pure hypercholesterolemia, unspecified: Secondary | ICD-10-CM | POA: Diagnosis not present

## 2024-06-24 LAB — CBC WITH DIFFERENTIAL/PLATELET
Basophils Absolute: 0 K/uL (ref 0.0–0.1)
Basophils Relative: 0.5 % (ref 0.0–3.0)
Eosinophils Absolute: 0 K/uL (ref 0.0–0.7)
Eosinophils Relative: 0.6 % (ref 0.0–5.0)
HCT: 42.1 % (ref 36.0–46.0)
Hemoglobin: 13.8 g/dL (ref 12.0–15.0)
Lymphocytes Relative: 21 % (ref 12.0–46.0)
Lymphs Abs: 1.1 K/uL (ref 0.7–4.0)
MCHC: 32.8 g/dL (ref 30.0–36.0)
MCV: 94.6 fl (ref 78.0–100.0)
Monocytes Absolute: 0.4 K/uL (ref 0.1–1.0)
Monocytes Relative: 6.6 % (ref 3.0–12.0)
Neutro Abs: 3.9 K/uL (ref 1.4–7.7)
Neutrophils Relative %: 71.3 % (ref 43.0–77.0)
Platelets: 240 K/uL (ref 150.0–400.0)
RBC: 4.45 Mil/uL (ref 3.87–5.11)
RDW: 13.3 % (ref 11.5–15.5)
WBC: 5.4 K/uL (ref 4.0–10.5)

## 2024-06-24 LAB — COMPREHENSIVE METABOLIC PANEL WITH GFR
ALT: 17 U/L (ref 0–35)
AST: 25 U/L (ref 0–37)
Albumin: 4.2 g/dL (ref 3.5–5.2)
Alkaline Phosphatase: 91 U/L (ref 39–117)
BUN: 12 mg/dL (ref 6–23)
CO2: 34 meq/L — ABNORMAL HIGH (ref 19–32)
Calcium: 9.8 mg/dL (ref 8.4–10.5)
Chloride: 100 meq/L (ref 96–112)
Creatinine, Ser: 0.74 mg/dL (ref 0.40–1.20)
GFR: 74.12 mL/min (ref >60.00)
Glucose, Bld: 93 mg/dL (ref 70–99)
Potassium: 4.4 meq/L (ref 3.5–5.1)
Sodium: 141 meq/L (ref 135–145)
Total Bilirubin: 0.7 mg/dL (ref 0.2–1.2)
Total Protein: 7 g/dL (ref 6.0–8.3)

## 2024-06-24 LAB — LIPID PANEL
Cholesterol: 166 mg/dL (ref 0–200)
HDL: 85.4 mg/dL (ref >39.00)
LDL Cholesterol: 67 mg/dL (ref 0–99)
NonHDL: 80.79
Total CHOL/HDL Ratio: 2
Triglycerides: 67 mg/dL (ref 0.0–149.0)
VLDL: 13.4 mg/dL (ref 0.0–40.0)

## 2024-06-24 MED ORDER — ALBUTEROL SULFATE HFA 108 (90 BASE) MCG/ACT IN AERS: 2.0000 | INHALATION_SPRAY | Freq: Four times a day (QID) | RESPIRATORY_TRACT | 0 refills | Status: AC | PRN

## 2024-06-24 MED ORDER — BREZTRI AEROSPHERE 160-9-4.8 MCG/ACT IN AERO: 2.0000 | INHALATION_SPRAY | Freq: Two times a day (BID) | RESPIRATORY_TRACT | 11 refills | Status: AC

## 2024-06-24 MED ORDER — HYDROCHLOROTHIAZIDE 12.5 MG PO TABS: 12.5000 mg | ORAL_TABLET | Freq: Every day | ORAL | 3 refills | Status: AC

## 2024-06-24 MED ORDER — ATORVASTATIN CALCIUM 20 MG PO TABS
20.0000 mg | ORAL_TABLET | Freq: Every day | ORAL | 3 refills | Status: AC
Start: 2024-06-24 — End: ?
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
## 2024-06-25 ENCOUNTER — Ambulatory Visit: Payer: Self-pay | Admitting: Family Medicine

## 2024-06-28 ENCOUNTER — Other Ambulatory Visit: Payer: Self-pay | Admitting: Family Medicine

## 2024-06-28 DIAGNOSIS — Z1231 Encounter for screening mammogram for malignant neoplasm of breast: Secondary | ICD-10-CM

## 2024-07-14 ENCOUNTER — Other Ambulatory Visit: Payer: Self-pay | Admitting: Family Medicine

## 2024-07-14 DIAGNOSIS — I10 Essential (primary) hypertension: Secondary | ICD-10-CM

## 2024-07-14 DIAGNOSIS — E7800 Pure hypercholesterolemia, unspecified: Secondary | ICD-10-CM

## 2024-07-16 ENCOUNTER — Inpatient Hospital Stay
Admission: RE | Admit: 2024-07-16 | Discharge: 2024-07-16 | Disposition: A | Discharge status: Home / Self Care | Source: Ambulatory Visit | Attending: Family Medicine | Admitting: Family Medicine

## 2024-07-16 ENCOUNTER — Ambulatory Visit

## 2024-07-16 DIAGNOSIS — Z1231 Encounter for screening mammogram for malignant neoplasm of breast: Secondary | ICD-10-CM

## 2024-08-06 ENCOUNTER — Ambulatory Visit

## 2024-10-13 ENCOUNTER — Ambulatory Visit (INDEPENDENT_AMBULATORY_CARE_PROVIDER_SITE_OTHER)

## 2024-10-13 DIAGNOSIS — Z23 Encounter for immunization: Secondary | ICD-10-CM

## 2024-10-28 ENCOUNTER — Encounter: Payer: Self-pay | Admitting: Podiatry

## 2024-10-28 ENCOUNTER — Ambulatory Visit: Admitting: Podiatry

## 2024-10-28 VITALS — Ht 62.0 in | Wt 110.0 lb

## 2024-10-28 DIAGNOSIS — Q828 Other specified congenital malformations of skin: Secondary | ICD-10-CM

## 2024-10-28 DIAGNOSIS — L600 Ingrowing nail: Secondary | ICD-10-CM

## 2024-10-28 NOTE — Progress Notes (Signed)
 Subjective:   Patient ID: Gabriella Craig, female   DOB: 83 y.o.   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  Lateral border right medial border left very sore she has tried trimming soaks and has been to several doctors who did a trimming which was not effective.  She is interested in permanent procedure and also has lesions underneath both feet.  Patient does not smoke likes to be active   Review of Systems  All other systems reviewed and are negative.       Objective:  Physical Exam Vitals and nursing note reviewed.  Constitutional:      Appearance: She is well-developed.  Pulmonary:     Effort: Pulmonary effort is normal.  Musculoskeletal:        General: Normal range of motion.  Skin:    General: Skin is warm.  Neurological:     Mental Status: She is alert.     Neurovascular status found to be intact muscle strength found to be adequate range of motion adequate with good digital perfusion noted.  Patient is found to have incurvated lateral border right hallux painful no redness or drainage and medial border left hallux in the same condition.  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  She wants a permanent procedure and I did explain surgery risk and I allowed her to read and signed consent form for correction of deformity and I infiltrated each big toe 60 mg Xylocaine Marcaine  mixture sterile prep done using sterile instrumentation removed the lateral border right big toe medial border left big toe exposed matrix applied phenol 3 applications 30 seconds followed by alcohol  lavage sterile dressing gave instructions on soaks wear dressings 24 hours take them off earlier if throbbing were to occur and encouraged to call with questions.  Courtesy debridement of lesions done can be seen back as needed
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
## 2024-11-01 ENCOUNTER — Encounter: Payer: Self-pay | Admitting: Podiatry

## 2024-11-02 ENCOUNTER — Telehealth: Payer: Self-pay | Admitting: Podiatry

## 2024-11-02 ENCOUNTER — Other Ambulatory Visit: Payer: Self-pay | Admitting: Podiatry

## 2024-11-02 MED ORDER — DOXYCYCLINE HYCLATE 100 MG PO TABS: 100.0000 mg | ORAL_TABLET | Freq: Two times a day (BID) | ORAL | 0 refills | Status: DC

## 2024-11-02 NOTE — Telephone Encounter (Signed)
 Daughter Caylin called on behalf of her sister and mother. They report that a nurse informed them yesterday that the doctor would respond within 24 hours; however, they have not yet received a response. They now state that the patient's toes appear worse than the photos uploaded yesterday and they are very concerned.  They are requesting that an antibiotic be called into the pharmacy on file to see if it may help prior to scheduling an appointment, unless you feel the patient needs to be seen urgently.  Please advise and follow up with the daughter listed as the emergency contact. Thank you.

## 2024-11-02 NOTE — Telephone Encounter (Signed)
 Left message no answer

## 2024-11-04 ENCOUNTER — Ambulatory Visit: Admitting: Podiatry

## 2024-11-04 ENCOUNTER — Encounter: Payer: Self-pay | Admitting: Podiatry

## 2024-11-04 DIAGNOSIS — L03032 Cellulitis of left toe: Secondary | ICD-10-CM

## 2024-11-04 NOTE — Patient Instructions (Signed)

## 2024-11-05 NOTE — Progress Notes (Signed)
 Subjective:   Patient ID: Gabriella Craig, female   DOB: 83 y.o.   MRN: [id number]   HPI Patient presents with daughter concerned about some swelling in the left foot after ingrown toenail removal several days ago.  States it was red is not now and they have started an antibiotic but wanted it looked at   ROS      Objective:  Physical Exam  Neurovascular status intact with negative Toula' sign noted.  Patient has midfoot edema left +1 pitting localized nail surgery bilateral hallux appear to be healing well localized crusted tissue no active drainage noted consistent with this.  Postop with no systemic signs of infection no fever no other indications of systemic infection     Assessment:  Possibility that this is just a more inflammatory condition or venous engorgement.  I cannot rule out any type of cellulitic event but it does not appear and certainly not systemic     Plan:  Explained to family we will start her on elevation compression with Ace wrap and I want her to continue antibiotic.  I do assume this will be uneventful if any issues were to occur they are to contact us  immediately

## 2024-11-17 ENCOUNTER — Ambulatory Visit: Payer: Self-pay

## 2024-11-17 NOTE — Telephone Encounter (Addendum)
 CMA spoke with pt; per pt, last night she had a bad headache, took OTC meds; neck pain 3-4 days; diarrhea, dizzy; denies chest pain or arm pain. Confirmed last BP was 154/64, pt was offered appt with PCP. Appt scheduled for Friday 12/05

## 2024-11-17 NOTE — Telephone Encounter (Signed)
 [phone] patients home number daughter advised Daughter wants a call back from the office at [phone] Called [facility] to advise patient's symptoms and daughter stating patient wont go to the ER at this time and being unable to contact patient--was advised to send a CRM Called daughter to advise her that the patient did not answer and daughter states she will call the patient and tell her what was advised that she should go to the ER for further evaluation   FYI Only or Action Required?: Action required by provider: update on patient condition and ER Refusal.  Patient was last seen in primary care on 06/24/2024 by McGowen, Aleene DEL, MD.  Called Nurse Triage reporting Neurologic Problem.  Symptoms began today.  Interventions attempted: Nothing.  Symptoms are: unchanged.  Triage Disposition: Go to ED Now (Notify PCP)  Patient/caregiver understands and will follow disposition?: No, wishes to speak with PCP              Copied from CRM #[id number]. Topic: Clinical - Red Word Triage >> Nov 17, 2024 11:21 AM Viola FALCON wrote: Red Word that prompted transfer to Nurse Triage: Patient daughter [name] calling regardign patients severe neck pain, headaches, blacked out this morning and elevated blood pressure - requesting appt Reason for Disposition  [1] Loss of speech or garbled speech AND [2] sudden onset AND [3] brief (now gone)  Answer Assessment - Initial Assessment Questions Daughter called and advised that the patient had told her she didn't feel good, bp was elevated, had diarrhea, and blacked out & a very bad headache during the night For the past two days patient has had back neck pain and headaches Daughter states the patient's speech seemed slurred or slower and it seemed to improve during the conversation Blood pressure was in the 150s per patient to daughter  This RN advised her that the recommendation at this time is for the patient to go to the Emergency  Room. Daughter states that the patient does not know she was calling for her and she states that the patient will not go to the hospital.  [phone] patients home number daughter advised  This RN attempted to call the patient at two numbers listed for her and no answer--left a voicemail on mobile for a call back  Daughter wants a call back from the office  Protocols used: Neurologic Deficit-A-AH

## 2024-11-19 ENCOUNTER — Ambulatory Visit: Admitting: Family Medicine

## 2024-11-19 ENCOUNTER — Encounter: Payer: Self-pay | Admitting: Family Medicine

## 2024-11-19 VITALS — BP 138/84 | HR 80 | Temp 97.6°F | Ht 62.0 in | Wt 111.6 lb

## 2024-11-19 DIAGNOSIS — I10 Essential (primary) hypertension: Secondary | ICD-10-CM

## 2024-11-19 DIAGNOSIS — G4489 Other headache syndrome: Secondary | ICD-10-CM | POA: Diagnosis not present

## 2024-11-19 DIAGNOSIS — G459 Transient cerebral ischemic attack, unspecified: Secondary | ICD-10-CM | POA: Diagnosis not present

## 2024-11-19 NOTE — Patient Instructions (Signed)
 Take a whole hydrochlorothiazide  12.5mg  tab every morning and HALF of tab in the evening.
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
## 2024-11-30 ENCOUNTER — Ambulatory Visit (HOSPITAL_BASED_OUTPATIENT_CLINIC_OR_DEPARTMENT_OTHER): Admission: RE | Admit: 2024-11-30 | Discharge: 2024-11-30 | Attending: Family Medicine | Admitting: Family Medicine

## 2024-11-30 DIAGNOSIS — G459 Transient cerebral ischemic attack, unspecified: Secondary | ICD-10-CM | POA: Insufficient documentation

## 2024-11-30 MED ORDER — GADOBUTROL 1 MMOL/ML IV SOLN
5.0000 mL | Freq: Once | INTRAVENOUS | Status: AC | PRN
  Administered 2024-11-30: 11:00:00 5 mL via INTRAVENOUS

## 2024-12-02 ENCOUNTER — Ambulatory Visit: Payer: Self-pay | Admitting: Family Medicine

## 2024-12-31 ENCOUNTER — Encounter: Payer: Self-pay | Admitting: Family Medicine

## 2024-12-31 ENCOUNTER — Ambulatory Visit: Admitting: Family Medicine

## 2024-12-31 VITALS — BP 145/79 | HR 73 | Temp 97.2°F | Ht 62.0 in | Wt 108.6 lb

## 2024-12-31 DIAGNOSIS — Z8673 Personal history of transient ischemic attack (TIA), and cerebral infarction without residual deficits: Secondary | ICD-10-CM

## 2024-12-31 DIAGNOSIS — E7800 Pure hypercholesterolemia, unspecified: Secondary | ICD-10-CM

## 2024-12-31 DIAGNOSIS — G8929 Other chronic pain: Secondary | ICD-10-CM | POA: Diagnosis not present

## 2024-12-31 DIAGNOSIS — M533 Sacrococcygeal disorders, not elsewhere classified: Secondary | ICD-10-CM | POA: Diagnosis not present

## 2024-12-31 DIAGNOSIS — I10 Essential (primary) hypertension: Secondary | ICD-10-CM | POA: Diagnosis not present

## 2024-12-31 LAB — BASIC METABOLIC PANEL WITH GFR
BUN: 25 mg/dL — ABNORMAL HIGH (ref 6–23)
CO2: 32 meq/L (ref 19–32)
Calcium: 9.5 mg/dL (ref 8.4–10.5)
Chloride: 104 meq/L (ref 96–112)
Creatinine, Ser: 0.66 mg/dL (ref 0.40–1.20)
GFR: 80.07 mL/min
Glucose, Bld: 83 mg/dL (ref 70–99)
Potassium: 4.1 meq/L (ref 3.5–5.1)
Sodium: 144 meq/L (ref 135–145)

## 2024-12-31 LAB — LIPID PANEL
Cholesterol: 159 mg/dL (ref 28–200)
HDL: 76.4 mg/dL
LDL Cholesterol: 71 mg/dL (ref 10–99)
NonHDL: 82.81
Total CHOL/HDL Ratio: 2
Triglycerides: 58 mg/dL (ref 10.0–149.0)
VLDL: 11.6 mg/dL (ref 0.0–40.0)
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
## 2025-01-02 ENCOUNTER — Ambulatory Visit: Payer: Self-pay | Admitting: Family Medicine

## 2025-02-04 ENCOUNTER — Ambulatory Visit: Admitting: Family Medicine
